# Patient Record
Sex: Female | Born: 1950 | Race: White | Hispanic: No | Marital: Married | Smoking: Former smoker
Health system: Southern US, Community
[De-identification: ages and names within clinical notes are randomized; demographics above are authoritative.]

## PROBLEM LIST (undated history)

## (undated) DIAGNOSIS — I2510 Atherosclerotic heart disease of native coronary artery without angina pectoris: Secondary | ICD-10-CM

## (undated) DIAGNOSIS — E119 Type 2 diabetes mellitus without complications: Secondary | ICD-10-CM

## (undated) DIAGNOSIS — I6529 Occlusion and stenosis of unspecified carotid artery: Secondary | ICD-10-CM

## (undated) HISTORY — PX: CARDIAC SURGERY: SHX584

## (undated) HISTORY — DX: Occlusion and stenosis of unspecified carotid artery: I65.29

---

## 1997-10-13 ENCOUNTER — Other Ambulatory Visit: Admission: RE | Admit: 1997-10-13 | Discharge: 1997-10-13 | Payer: Self-pay | Admitting: Obstetrics and Gynecology

## 1997-11-22 ENCOUNTER — Ambulatory Visit (HOSPITAL_COMMUNITY): Admission: RE | Admit: 1997-11-22 | Discharge: 1997-11-22 | Payer: Self-pay | Admitting: Neurology

## 1999-03-11 ENCOUNTER — Encounter: Payer: Self-pay | Admitting: Urology

## 1999-03-11 ENCOUNTER — Ambulatory Visit (HOSPITAL_COMMUNITY): Admission: RE | Admit: 1999-03-11 | Discharge: 1999-03-11 | Payer: Self-pay | Admitting: Urology

## 1999-03-29 ENCOUNTER — Ambulatory Visit (HOSPITAL_COMMUNITY): Admission: RE | Admit: 1999-03-29 | Discharge: 1999-03-29 | Payer: Self-pay | Admitting: Gastroenterology

## 2000-07-01 ENCOUNTER — Encounter: Admission: RE | Admit: 2000-07-01 | Discharge: 2000-07-01 | Payer: Self-pay | Admitting: Obstetrics and Gynecology

## 2000-07-01 ENCOUNTER — Encounter: Payer: Self-pay | Admitting: Obstetrics and Gynecology

## 2010-09-27 ENCOUNTER — Ambulatory Visit: Payer: Self-pay

## 2010-10-15 ENCOUNTER — Inpatient Hospital Stay (INDEPENDENT_AMBULATORY_CARE_PROVIDER_SITE_OTHER)
Admission: RE | Admit: 2010-10-15 | Discharge: 2010-10-15 | Disposition: A | Discharge status: Home / Self Care | Payer: Self-pay | Source: Ambulatory Visit | Attending: Emergency Medicine | Admitting: Emergency Medicine

## 2010-10-15 DIAGNOSIS — M79609 Pain in unspecified limb: Secondary | ICD-10-CM

## 2010-10-15 LAB — GLUCOSE, CAPILLARY
Glucose-Capillary: 49 mg/dL — ABNORMAL LOW (ref 70–99)
Glucose-Capillary: 99 mg/dL (ref 70–99)

## 2011-09-18 ENCOUNTER — Ambulatory Visit: Payer: Self-pay

## 2011-11-16 ENCOUNTER — Emergency Department (HOSPITAL_COMMUNITY)
Admission: EM | Admit: 2011-11-16 | Discharge: 2011-11-16 | Disposition: A | Discharge status: Home / Self Care | Payer: BC Managed Care – PPO | Source: Home / Self Care | Attending: Family Medicine | Admitting: Family Medicine

## 2011-11-16 ENCOUNTER — Encounter (HOSPITAL_COMMUNITY): Payer: Self-pay

## 2011-11-16 DIAGNOSIS — L03012 Cellulitis of left finger: Secondary | ICD-10-CM

## 2011-11-16 DIAGNOSIS — L03019 Cellulitis of unspecified finger: Secondary | ICD-10-CM

## 2011-11-16 HISTORY — DX: Atherosclerotic heart disease of native coronary artery without angina pectoris: I25.10

## 2011-11-16 HISTORY — DX: Type 2 diabetes mellitus without complications: E11.9

## 2011-11-16 MED ORDER — CEPHALEXIN 500 MG PO CAPS: 500.0000 mg | ORAL_CAPSULE | Freq: Three times a day (TID) | ORAL | Status: DC

## 2011-11-16 NOTE — ED Provider Notes (Signed)
History     CSN: [id number]  Arrival date & time 11/16/11  0802   First MD Initiated Contact with Patient 11/16/11 [phone]      Chief Complaint  Patient presents with  . Finger Injury    (Consider location/radiation/quality/duration/timing/severity/associated sxs/prior treatment) Patient is a 61 y.o. female presenting with hand pain. The history is provided by the patient.  Hand Pain This is a new problem. The current episode started more than 1 week ago (is brittle diabetic, had ails done at salon, soreness and swelling began 13 days ago. no drainage.). The problem has been gradually worsening.    Past Medical History  Diagnosis Date  . Diabetes mellitus without complication   . Coronary artery disease     Past Surgical History  Procedure Date  . Cardiac surgery     History reviewed. No pertinent family history.  History  Substance Use Topics  . Smoking status: Not on file  . Smokeless tobacco: Not on file  . Alcohol Use:     OB History    Grav Para Term Preterm Abortions TAB SAB Ect Mult Living                  Review of Systems  Constitutional: Negative.   Skin: Positive for wound.    Allergies  Demerol and Lomotil  Home Medications   Current Outpatient Rx  Name  Route  Sig  Dispense  Refill  . ASPIRIN 81 MG PO TABS   Oral   Take 81 mg by mouth daily.         . ATENOLOL 25 MG PO TABS   Oral   Take 25 mg by mouth daily.         Marland Kitchen FLUOXETINE HCL 20 MG PO CAPS   Oral   Take 20 mg by mouth daily.         . INSULIN GLARGINE 100 UNIT/ML [place] SOLN   Subcutaneous   Inject into the skin at bedtime.         Marland Kitchen PANTOPRAZOLE SODIUM 40 MG PO TBEC   Oral   Take 40 mg by mouth daily.         Marland Kitchen ROSUVASTATIN CALCIUM 10 MG PO TABS   Oral   Take 10 mg by mouth daily.         Marland Kitchen VALSARTAN 40 MG PO TABS   Oral   Take 40 mg by mouth daily.         . CEPHALEXIN 500 MG PO CAPS   Oral   Take 1 capsule (500 mg total) by mouth 3 (three) times  daily. Take all of medicine and drink lots of fluids   21 capsule   0     BP 114/73  Pulse 80  Temp 98.7 F (37.1 C) (Oral)  Resp 18  SpO2 99%  Physical Exam  Nursing note and vitals reviewed. Constitutional: She is oriented to person, place, and time. She appears well-developed and well-nourished.  Musculoskeletal: She exhibits tenderness.  Neurological: She is alert and oriented to person, place, and time.  Skin: Skin is warm and dry. There is erythema.       Tender nonfluctuant cuticle base of left thumb, no drainage, sl erythema.    ED Course  Procedures (including critical care time)  Labs Reviewed - No data to display No results found.   1. Paronychia of left thumb       MDM  Linna Hoff, MD 11/16/11 [phone]

## 2011-11-16 NOTE — ED Notes (Signed)
Usually has nails done in salon, worked in garden a fe days ago w/o gloves . Noted pain, swelling nail bed w pain shooting into thumb and wrist

## 2011-11-20 ENCOUNTER — Encounter (HOSPITAL_BASED_OUTPATIENT_CLINIC_OR_DEPARTMENT_OTHER): Payer: Self-pay

## 2011-11-20 ENCOUNTER — Ambulatory Visit (HOSPITAL_BASED_OUTPATIENT_CLINIC_OR_DEPARTMENT_OTHER)
Admission: RE | Admit: 2011-11-20 | Discharge: 2011-11-20 | Disposition: A | Discharge status: Home / Self Care | Payer: BC Managed Care – PPO | Source: Ambulatory Visit | Attending: Orthopedic Surgery | Admitting: Orthopedic Surgery

## 2011-11-20 ENCOUNTER — Encounter (HOSPITAL_BASED_OUTPATIENT_CLINIC_OR_DEPARTMENT_OTHER)
Admission: RE | Disposition: A | Discharge status: Home / Self Care | Payer: Self-pay | Source: Ambulatory Visit | Attending: Orthopedic Surgery

## 2011-11-20 DIAGNOSIS — L03019 Cellulitis of unspecified finger: Secondary | ICD-10-CM | POA: Insufficient documentation

## 2011-11-20 HISTORY — PX: INCISION AND DRAINAGE: SHX5863

## 2011-11-20 SURGERY — INCISION AND DRAINAGE
Anesthesia: General | Site: Finger | Laterality: Left | Wound class: Dirty or Infected

## 2011-11-20 SURGICAL SUPPLY — 29 items
BLADE SURG 15 STRL LF DISP TIS (BLADE) IMPLANT
BLADE SURG 15 STRL SS (BLADE) ×2
BNDG COHESIVE 1X5 TAN STRL LF (GAUZE/BANDAGES/DRESSINGS) ×1 IMPLANT
CHLORAPREP W/TINT 26ML (MISCELLANEOUS) ×1 IMPLANT
CLOTH BEACON ORANGE TIMEOUT ST (SAFETY) ×1 IMPLANT
COVER MAYO STAND STRL (DRAPES) ×1 IMPLANT
COVER TABLE BACK 60X90 (DRAPES) ×1 IMPLANT
CUFF TOURNIQUET SINGLE 18IN (TOURNIQUET CUFF) ×1 IMPLANT
DRAPE EXTREMITY T 121X128X90 (DRAPE) ×1 IMPLANT
DRAPE SURG 17X23 STRL (DRAPES) ×1 IMPLANT
GAUZE PACKING IODOFORM 1/4X5 (PACKING) ×1 IMPLANT
GAUZE XEROFORM 1X8 LF (GAUZE/BANDAGES/DRESSINGS) ×1 IMPLANT
GLOVE BIO SURGEON STRL SZ7.5 (GLOVE) ×1 IMPLANT
GLOVE BIOGEL PI IND STRL 8 (GLOVE) IMPLANT
GLOVE BIOGEL PI INDICATOR 8 (GLOVE) ×1
GOWN STRL REIN XL XLG (GOWN DISPOSABLE) ×3 IMPLANT
NDL HYPO 25X1 1.5 SAFETY (NEEDLE) IMPLANT
NEEDLE HYPO 25X1 1.5 SAFETY (NEEDLE) ×2 IMPLANT
NS IRRIG 1000ML POUR BTL (IV SOLUTION) ×1 IMPLANT
PACK BASIN DAY SURGERY FS (CUSTOM PROCEDURE TRAY) ×1 IMPLANT
PADDING CAST ABS 4INX4YD NS (CAST SUPPLIES) ×1
PADDING CAST ABS COTTON 4X4 ST (CAST SUPPLIES) IMPLANT
SPONGE GAUZE 4X4 12PLY (GAUZE/BANDAGES/DRESSINGS) ×1 IMPLANT
STOCKINETTE 4X48 STRL (DRAPES) ×1 IMPLANT
SWAB CULTURE LIQ STUART DBL (MISCELLANEOUS) ×1 IMPLANT
SYR CONTROL 10ML LL (SYRINGE) ×1 IMPLANT
TOWEL OR 17X24 6PK STRL BLUE (TOWEL DISPOSABLE) ×1 IMPLANT
TUBE ANAEROBIC SPECIMEN COL (MISCELLANEOUS) ×1 IMPLANT
UNDERPAD 30X30 INCONTINENT (UNDERPADS AND DIAPERS) ×1 IMPLANT

## 2011-11-21 LAB — POCT I-STAT, CHEM 8
Chloride: 102 mEq/L (ref 96–112)
Creatinine, Ser: 0.7 mg/dL (ref 0.50–1.10)
HCT: 38 % (ref 36.0–46.0)
Hemoglobin: 12.9 g/dL (ref 12.0–15.0)
Potassium: 4 mEq/L (ref 3.5–5.1)
Sodium: 140 mEq/L (ref 135–145)

## 2011-11-21 MED ORDER — BUPIVACAINE HCL 0.25 % IJ SOLN
INTRAMUSCULAR | Status: DC | PRN
  Administered 2011-11-20: 10 mL

## 2011-11-21 NOTE — H&P (Signed)
NAMEMarland Kitchen  ZOELLA, ROBERTI NO.:  [id number]  MEDICAL RECORD NO.:  [id number]  LOCATION:  [facility]                         FACILITY:  [facility]  PHYSICIAN:  Betha Loa, MD        DATE OF BIRTH:  Jun 01, 1950  DATE OF ADMISSION:  11/20/2011 DATE OF DISCHARGE:  11/20/2011                             HISTORY & PHYSICAL   CHIEF COMPLAINT:  Left thumb infection.  HISTORY:  Ms. Bessey is a 61 year old right-hand dominant female with diabetes who states that for past 2 weeks, she has been having problems with the left thumb.  She has worked in the yard and had her nails done at that time.  She began to have swelling and pain in the thumb.  She soaked it.  On November 9, she began to have more erythema and swelling. She has been able to get purulence coming from underneath the cuticle on the radial side.  She presented to Urgent Care where she was started on Keflex.  She states it has gotten worse since then.  She had a little fever on Saturday night but took some narcotic pain medication and thinks that might have been the source of this.  She has not had any problems since.  ALLERGIES:  DEMEROL.  PAST MEDICAL HISTORY:  Diabetes, heart disease, GERD, and hypercholesterolemia.  PAST SURGICAL HISTORY:  Coronary artery bypass grafting 2 vessel in 2001, cholecystectomy, and irrigation and debridement of her neck.  MEDICATIONS: 1. Atenolol. 2. Diovan 40 daily. 3. Lantus. 4. Apidra. 5. Protonix. 6. Aspirin 325 daily. 7. Crestor.  SOCIAL HISTORY:  Ms. Payes works as a Engineer, civil (consulting).  She is with the [employer].  She does not smoke and drinks alcohol socially.  REVIEW OF SYSTEMS:  A 13-point review of systems was negative with exception of fever as above and shortness of breath.  Otherwise negative 13 point review of systems.  PHYSICAL EXAMINATION:  GENERAL:  Alert and oriented x3, well developed, well nourished.  Normal gait pattern. HEART:  Regular rate and  rhythm. LUNGS:  Clear to auscultation bilaterally. ABDOMEN:  Nontender, nondistended. EXTREMITIES:  Bilateral upper extremities are intact to light touch, sensation to capillary refill in all fingertips.  She can flex and extend the IP joint of her thumb to cross her fingers.  Right upper extremity is without wounds, without tenderness to palpation.  She has no wounds.  Left upper extremity with the exception of the thumb, no wounds, no tenderness to palpation.  In the thumb, she is swollen and reddened at the radial side of the cuticle tissue.  There is evidence of drainage from underneath the radial side of the cuticle.  There is some visible yellowness underneath the radial side of the nail.  She is swollen and erythematous on the radial side.  She is not tender on the pad of the thumb.  No tenderness at the IP joint.  She can flex and extend the IP joint without pain.  ASSESSMENT AND PLAN:  Left thumb paronychia.  I discussed with Ms. Wisham the nature of the condition.  I recommended irrigation and debridement of the left thumb.  Risks, benefits, and alternatives of  surgery were discussed including risk of blood loss, infection, damage to nerves, vessels, tendons, ligaments, bone; failure of surgery; need for additional surgery, complications with wound healing, continued pain, continued infection.  She voiced understanding of these risks and elected to proceed.  We will have arrangement today.     Betha Loa, MD     KK/MEDQ  D:  11/20/2011  T:  11/21/2011  Job:  [id number]

## 2011-11-21 NOTE — Op Note (Signed)
NAME:  Melissa Patel, Melissa Patel NO.:  [id number]  MEDICAL RECORD NO.:  [id number]  LOCATION:                               FACILITY:  [facility]  PHYSICIAN:  Betha Loa, MD        DATE OF BIRTH:  1951/01/02  DATE OF PROCEDURE:  11/20/2011 DATE OF DISCHARGE:  11/20/2011                              OPERATIVE REPORT   PREOPERATIVE DIAGNOSIS:  Left thumb paronychia.  POSTOPERATIVE DIAGNOSIS:  Left thumb paronychia.  PROCEDURE:  Irrigation and debridement of left thumb paronychia.  SURGEON:  Betha Loa, MD  ASSISTANT:  Cindee Salt, MD  ANESTHESIA:  General.  IV FLUIDS:  Per anesthesia flow sheet.  ESTIMATED BLOOD LOSS:  Minimal.  COMPLICATIONS:  None.  SPECIMENS:  Cultures to micro.  TOURNIQUET TIME:  11 minutes.  DISPOSITION:  Stable to PACU.  INDICATIONS:  Melissa Patel is a 61 year old right-hand dominant female who has had 2 weeks of problems with her left thumb.  Over the weekend, she noticed it started to become more swollen, erythematous, and painful.  She has been able to squeeze some pus from underneath the cuticle he states.  She was seen at Urgent Care and started on Keflex. She has had no relief.  On evaluation, she had intact sensation and capillary refill in the finger.  She could flex and extend the IP joint of the thumb.  There was a swollen erythematous area on the radial side of the thumb and discoloration under the radial side of the nail.  I recommended Melissa Patel having the nail removed and irrigation and debridement performed.  Risks, benefits, and alternatives of surgery were discussed including risk of blood loss, infection, damage to nerves, vessels, tendons, ligaments, bone; failure of surgery; need for additional surgery, complications with wound healing, continued pain, continued infection, need for repeat irrigation and debridement.  She voiced understanding of these risks and elected to proceed.  OPERATIVE COURSE:  After being  identified preoperatively by myself, the patient and I agreed upon procedure and site procedure.  Surgical site was marked.  The risks, benefits, and alternatives of surgery were reviewed and she wished to proceed.  Surgical consent had been signed. She was transferred to the operating room, placed on the operating room table in supine position with the left upper extremity in arm board. General anesthesia was induced by the anesthesiologist.  The left upper extremity was prepped and draped in normal sterile orthopedic fashion. Surgical pause between surgeons, anesthesia, operating staff, and all were in agreement as to the patient, procedure, and site of procedure. Tourniquet at the proximal aspect of the forearm was inflated to 250 mmHg after exsanguination of the hand and forearm with an Esmarch bandage.  The nail was removed.  There was no gross purulence underneath.  An incision was made under the nail fold into the area of erythema.  A small amount of fluid was obtained.  Cultures were taken and sent to micro for examination.  The patient was given 1000 mg of vancomycin after cultures were taken.  The wound was copiously irrigated with sterile saline.  It was packed open with 0.25-inch iodoform gauze. A piece of  Xeroform was placed in the nail fold.  The wound was dressed with sterile Xeroform, 4 x 4, and wrapped with a Coban dressing lightly. A digital block was performed with 10 mL of 0.25% plain Marcaine to aid in postoperative analgesia.  The patient was awoken from anesthesia safely.  The drapes were broken down and the patient transferred back to stretcher.  She was taken to PACU in stable condition.  I will see her back in the office in 3 days for postoperative followup.  I will give her Norco 5/325, 1-2 p.o. q.6 hours p.r.n. pain, dispensed #30 and Bactrim DS 1 p.o. b.i.d. x7 days.     Betha Loa, MD     KK/MEDQ  D:  11/20/2011  T:  11/21/2011  Job:  [id number]

## 2011-11-22 ENCOUNTER — Encounter (HOSPITAL_BASED_OUTPATIENT_CLINIC_OR_DEPARTMENT_OTHER): Payer: Self-pay | Admitting: Orthopedic Surgery

## 2011-11-22 LAB — WOUND CULTURE

## 2011-11-25 LAB — ANAEROBIC CULTURE: Gram Stain: NONE SEEN

## 2012-01-25 ENCOUNTER — Emergency Department (HOSPITAL_COMMUNITY)
Admission: EM | Admit: 2012-01-25 | Discharge: 2012-01-25 | Disposition: A | Discharge status: Home / Self Care | Payer: BC Managed Care – PPO | Attending: Emergency Medicine | Admitting: Emergency Medicine

## 2012-01-25 ENCOUNTER — Emergency Department (HOSPITAL_COMMUNITY): Payer: BC Managed Care – PPO

## 2012-01-25 ENCOUNTER — Encounter (HOSPITAL_COMMUNITY): Payer: Self-pay | Admitting: *Deleted

## 2012-01-25 DIAGNOSIS — Z87891 Personal history of nicotine dependence: Secondary | ICD-10-CM | POA: Insufficient documentation

## 2012-01-25 DIAGNOSIS — R739 Hyperglycemia, unspecified: Secondary | ICD-10-CM

## 2012-01-25 DIAGNOSIS — Z79899 Other long term (current) drug therapy: Secondary | ICD-10-CM | POA: Insufficient documentation

## 2012-01-25 DIAGNOSIS — R112 Nausea with vomiting, unspecified: Secondary | ICD-10-CM | POA: Insufficient documentation

## 2012-01-25 DIAGNOSIS — I2510 Atherosclerotic heart disease of native coronary artery without angina pectoris: Secondary | ICD-10-CM | POA: Insufficient documentation

## 2012-01-25 DIAGNOSIS — Z7982 Long term (current) use of aspirin: Secondary | ICD-10-CM | POA: Insufficient documentation

## 2012-01-25 DIAGNOSIS — E1169 Type 2 diabetes mellitus with other specified complication: Secondary | ICD-10-CM | POA: Insufficient documentation

## 2012-01-25 DIAGNOSIS — Z794 Long term (current) use of insulin: Secondary | ICD-10-CM | POA: Insufficient documentation

## 2012-01-25 DIAGNOSIS — R109 Unspecified abdominal pain: Secondary | ICD-10-CM | POA: Insufficient documentation

## 2012-01-25 LAB — URINE MICROSCOPIC-ADD ON

## 2012-01-25 LAB — CBC
MCHC: 34.6 g/dL (ref 30.0–36.0)
RDW: 12.4 % (ref 11.5–15.5)

## 2012-01-25 LAB — COMPREHENSIVE METABOLIC PANEL
ALT: 11 U/L (ref 0–35)
AST: 13 U/L (ref 0–37)
Albumin: 3.6 g/dL (ref 3.5–5.2)
Alkaline Phosphatase: 58 U/L (ref 39–117)
Potassium: 3.7 mEq/L (ref 3.5–5.1)
Sodium: 132 mEq/L — ABNORMAL LOW (ref 135–145)
Total Protein: 6.8 g/dL (ref 6.0–8.3)

## 2012-01-25 LAB — GLUCOSE, CAPILLARY
Glucose-Capillary: 446 mg/dL — ABNORMAL HIGH (ref 70–99)
Glucose-Capillary: 229 mg/dL — ABNORMAL HIGH (ref 70–99)

## 2012-01-25 LAB — URINALYSIS, ROUTINE W REFLEX MICROSCOPIC
Bilirubin Urine: NEGATIVE
Hgb urine dipstick: NEGATIVE
Nitrite: NEGATIVE
Specific Gravity, Urine: <1.005 — ABNORMAL LOW (ref 1.005–1.030)
pH: 6 (ref 5.0–8.0)

## 2012-01-25 LAB — POCT I-STAT TROPONIN I

## 2012-01-25 MED ORDER — MORPHINE SULFATE 4 MG/ML IJ SOLN
4.0000 mg | Freq: Once | INTRAMUSCULAR | Status: AC
Start: 2012-01-25 — End: 2012-01-25
  Administered 2012-01-25: 4 mg via INTRAVENOUS
  Filled 2012-01-25: qty 1

## 2012-01-25 MED ORDER — ONDANSETRON HCL 4 MG/2ML IJ SOLN
4.0000 mg | Freq: Once | INTRAMUSCULAR | Status: AC
  Administered 2012-01-25: 4 mg via INTRAVENOUS
  Filled 2012-01-25: qty 2

## 2012-01-25 MED ORDER — GI COCKTAIL ~~LOC~~
30.0000 mL | Freq: Once | ORAL | Status: AC
  Administered 2012-01-25: 30 mL via ORAL
  Filled 2012-01-25: qty 30

## 2012-01-25 MED ORDER — GI COCKTAIL ~~LOC~~: 30.0000 mL | Freq: Once | ORAL | Status: DC

## 2012-01-25 MED ORDER — ONDANSETRON HCL 4 MG PO TABS: 4.0000 mg | ORAL_TABLET | Freq: Four times a day (QID) | ORAL | Status: DC

## 2012-01-25 MED ORDER — INSULIN ASPART 100 UNIT/ML ~~LOC~~ SOLN
5.0000 [IU] | Freq: Once | SUBCUTANEOUS | Status: AC
  Administered 2012-01-25: 5 [IU] via INTRAVENOUS
  Filled 2012-01-25: qty 1

## 2012-01-25 MED ORDER — ESOMEPRAZOLE MAGNESIUM 40 MG PO CPDR: 40.0000 mg | DELAYED_RELEASE_CAPSULE | Freq: Every day | ORAL | Status: DC

## 2012-01-25 NOTE — ED Provider Notes (Addendum)
History     CSN: [id number]  Arrival date & time 01/25/12  7829   First MD Initiated Contact with Patient 01/25/12 [phone]      Chief Complaint  Patient presents with  . Abdominal Pain    (Consider location/radiation/quality/duration/timing/severity/associated sxs/prior treatment) Patient is a 62 y.o. female presenting with abdominal pain. The history is provided by the patient.  Abdominal Pain The primary symptoms of the illness include abdominal pain, nausea and vomiting. The current episode started yesterday. The onset of the illness was gradual. The problem has not changed since onset. The patient states that she believes she is currently not pregnant. The patient has not had a change in bowel habit. Significant associated medical issues include diabetes.    Past Medical History  Diagnosis Date  . Diabetes mellitus without complication   . Coronary artery disease     Past Surgical History  Procedure Date  . Cardiac surgery   . Incision and drainage 11/20/2011    Procedure: INCISION AND DRAINAGE;  Surgeon: Tami Ribas, MD;  Location: [facility];  Service: Orthopedics;  Laterality: Left;  INCISION AND DRAINAGE LEFT THUMB     No family history on file.  History  Substance Use Topics  . Smoking status: Former Games developer  . Smokeless tobacco: Not on file  . Alcohol Use: Yes    OB History    Grav Para Term Preterm Abortions TAB SAB Ect Mult Living                  Review of Systems  Gastrointestinal: Positive for nausea, vomiting and abdominal pain.  All other systems reviewed and are negative.    Allergies  Demerol and Lomotil  Home Medications   Current Outpatient Rx  Name  Route  Sig  Dispense  Refill  . ASPIRIN 81 MG PO TABS   Oral   Take 81 mg by mouth daily.         . ATENOLOL 25 MG PO TABS   Oral   Take 25 mg by mouth daily.         . CEPHALEXIN 500 MG PO CAPS   Oral   Take 1 capsule (500 mg total) by mouth 3 (three) times  daily. Take all of medicine and drink lots of fluids   21 capsule   0   . FLUOXETINE HCL 20 MG PO CAPS   Oral   Take 20 mg by mouth daily.         . INSULIN GLARGINE 100 UNIT/ML [place] SOLN   Subcutaneous   Inject into the skin at bedtime.         Marland Kitchen PANTOPRAZOLE SODIUM 40 MG PO TBEC   Oral   Take 40 mg by mouth daily.         Marland Kitchen ROSUVASTATIN CALCIUM 10 MG PO TABS   Oral   Take 10 mg by mouth daily.         Marland Kitchen VALSARTAN 40 MG PO TABS   Oral   Take 40 mg by mouth daily.           BP 139/74  Pulse 87  Temp 97.9 F (36.6 C) (Oral)  Resp 18  SpO2 100%  Physical Exam  Constitutional: She is oriented to person, place, and time. She appears well-developed and well-nourished.  HENT:  Head: Normocephalic and atraumatic.  Eyes: Conjunctivae normal and EOM are normal. Pupils are equal, round, and reactive to light.  Neck: Normal range of  motion.  Cardiovascular: Normal rate, regular rhythm and normal heart sounds.   Pulmonary/Chest: Effort normal and breath sounds normal.  Abdominal: Soft. Bowel sounds are normal. There is tenderness.    Musculoskeletal: Normal range of motion.  Neurological: She is alert and oriented to person, place, and time.  Skin: Skin is warm and dry.  Psychiatric: She has a normal mood and affect. Her behavior is normal.    ED Course  Procedures (including critical care time)  Labs Reviewed  GLUCOSE, CAPILLARY - Abnormal; Notable for the following:    Glucose-Capillary 446 (*)     All other components within normal limits  CBC  COMPREHENSIVE METABOLIC PANEL  LIPASE, BLOOD  URINALYSIS, ROUTINE W REFLEX MICROSCOPIC   No results found.   No diagnosis found.   Date: 01/25/2012  Rate: 75  Rhythm: normal sinus rhythm  QRS Axis: normal  Intervals: normal  ST/T Wave abnormalities: normal  Conduction Disutrbances: none  Narrative Interpretation: unremarkable     MDM  + abd pain, will labs, reassess  Labs benign.  Improved.  No  dka,  Will dc to fu,  Ret new/worsening sxs     Vanessa Alesi Lytle Michaels, MD 01/25/12 0439  Nashon Erbes Lytle Michaels, MD 01/25/12 0502  Rosanne Ashing, MD 01/25/12 [phone]

## 2012-01-25 NOTE — ED Notes (Addendum)
Worsening lt. Sided abd. Pain. Since Sunday. No change in diet. Normal bm today. Blood sugars in consistent. > 400. But did drop to 50 while at bank. Nauseated and no emesis.

## 2012-01-25 NOTE — ED Notes (Signed)
EDP at bedside  

## 2012-01-25 NOTE — ED Notes (Signed)
Radiology at bedside to get portable chest.

## 2012-04-02 ENCOUNTER — Other Ambulatory Visit: Payer: Self-pay | Admitting: *Deleted

## 2012-04-02 ENCOUNTER — Ambulatory Visit
Admission: RE | Admit: 2012-04-02 | Discharge: 2012-04-02 | Disposition: A | Discharge status: Home / Self Care | Payer: BC Managed Care – PPO | Source: Ambulatory Visit | Attending: *Deleted | Admitting: *Deleted

## 2012-04-02 DIAGNOSIS — M549 Dorsalgia, unspecified: Secondary | ICD-10-CM

## 2012-05-30 ENCOUNTER — Other Ambulatory Visit: Payer: Self-pay | Admitting: Obstetrics and Gynecology

## 2012-05-30 DIAGNOSIS — N6323 Unspecified lump in the left breast, lower outer quadrant: Secondary | ICD-10-CM

## 2012-05-30 DIAGNOSIS — N644 Mastodynia: Secondary | ICD-10-CM

## 2012-06-11 ENCOUNTER — Ambulatory Visit
Admission: RE | Admit: 2012-06-11 | Discharge: 2012-06-11 | Disposition: A | Discharge status: Home / Self Care | Payer: BC Managed Care – PPO | Source: Ambulatory Visit | Attending: Obstetrics and Gynecology | Admitting: Obstetrics and Gynecology

## 2012-06-11 DIAGNOSIS — N644 Mastodynia: Secondary | ICD-10-CM

## 2012-06-11 DIAGNOSIS — N6323 Unspecified lump in the left breast, lower outer quadrant: Secondary | ICD-10-CM

## 2012-06-19 ENCOUNTER — Encounter (HOSPITAL_COMMUNITY): Payer: Self-pay | Admitting: *Deleted

## 2012-06-19 ENCOUNTER — Emergency Department (HOSPITAL_COMMUNITY)
Admission: EM | Admit: 2012-06-19 | Discharge: 2012-06-19 | Disposition: A | Discharge status: Home / Self Care | Payer: BC Managed Care – PPO | Attending: Emergency Medicine | Admitting: Emergency Medicine

## 2012-06-19 ENCOUNTER — Emergency Department (HOSPITAL_COMMUNITY): Payer: BC Managed Care – PPO

## 2012-06-19 DIAGNOSIS — Z7982 Long term (current) use of aspirin: Secondary | ICD-10-CM | POA: Insufficient documentation

## 2012-06-19 DIAGNOSIS — Z794 Long term (current) use of insulin: Secondary | ICD-10-CM | POA: Insufficient documentation

## 2012-06-19 DIAGNOSIS — E162 Hypoglycemia, unspecified: Secondary | ICD-10-CM | POA: Insufficient documentation

## 2012-06-19 DIAGNOSIS — I2510 Atherosclerotic heart disease of native coronary artery without angina pectoris: Secondary | ICD-10-CM | POA: Insufficient documentation

## 2012-06-19 DIAGNOSIS — E1169 Type 2 diabetes mellitus with other specified complication: Secondary | ICD-10-CM | POA: Insufficient documentation

## 2012-06-19 DIAGNOSIS — G40909 Epilepsy, unspecified, not intractable, without status epilepticus: Secondary | ICD-10-CM | POA: Insufficient documentation

## 2012-06-19 DIAGNOSIS — R51 Headache: Secondary | ICD-10-CM | POA: Insufficient documentation

## 2012-06-19 DIAGNOSIS — Z79899 Other long term (current) drug therapy: Secondary | ICD-10-CM | POA: Insufficient documentation

## 2012-06-19 DIAGNOSIS — Z87891 Personal history of nicotine dependence: Secondary | ICD-10-CM | POA: Insufficient documentation

## 2012-06-19 LAB — CBC WITH DIFFERENTIAL/PLATELET
Basophils Absolute: 0 10*3/uL (ref 0.0–0.1)
Basophils Relative: 0 % (ref 0–1)
Eosinophils Relative: 1 % (ref 0–5)
HCT: 35 % — ABNORMAL LOW (ref 36.0–46.0)
Lymphocytes Relative: 20 % (ref 12–46)
MCHC: 33.4 g/dL (ref 30.0–36.0)
MCV: 91.4 fL (ref 78.0–100.0)
Monocytes Absolute: 0.4 10*3/uL (ref 0.1–1.0)
Platelets: 183 10*3/uL (ref 150–400)
RDW: 12.1 % (ref 11.5–15.5)
WBC: 6.8 10*3/uL (ref 4.0–10.5)

## 2012-06-19 LAB — BASIC METABOLIC PANEL
CO2: 27 mEq/L (ref 19–32)
Calcium: 9.1 mg/dL (ref 8.4–10.5)
Creatinine, Ser: 0.42 mg/dL — ABNORMAL LOW (ref 0.50–1.10)
GFR calc Af Amer: >90 mL/min (ref >90)
GFR calc non Af Amer: >90 mL/min (ref >90)
Sodium: 139 mEq/L (ref 135–145)

## 2012-06-19 LAB — GLUCOSE, CAPILLARY
Glucose-Capillary: 213 mg/dL — ABNORMAL HIGH (ref 70–99)
Glucose-Capillary: 300 mg/dL — ABNORMAL HIGH (ref 70–99)

## 2012-06-19 MED ORDER — KETOROLAC TROMETHAMINE 30 MG/ML IJ SOLN
30.0000 mg | Freq: Once | INTRAMUSCULAR | Status: AC
  Administered 2012-06-19: 30 mg via INTRAVENOUS
  Filled 2012-06-19: qty 1

## 2012-06-19 MED ORDER — DIPHENHYDRAMINE HCL 50 MG/ML IJ SOLN
25.0000 mg | Freq: Once | INTRAMUSCULAR | Status: AC
  Administered 2012-06-19: 25 mg via INTRAVENOUS
  Filled 2012-06-19: qty 1

## 2012-06-19 MED ORDER — METOCLOPRAMIDE HCL 5 MG/ML IJ SOLN
10.0000 mg | Freq: Once | INTRAMUSCULAR | Status: AC
  Administered 2012-06-19: 10 mg via INTRAVENOUS
  Filled 2012-06-19: qty 2

## 2012-06-19 MED ORDER — DEXTROSE-NACL 5-0.9 % IV SOLN
Freq: Once | INTRAVENOUS | Status: AC
  Administered 2012-06-19: 19:00:00 via INTRAVENOUS

## 2012-06-19 NOTE — ED Notes (Signed)
[id number]<XB> Expected date:<BR> Expected time:<BR> Means of arrival:<BR> Comments:<BR> ems- hypoglycemia

## 2012-06-19 NOTE — ED Provider Notes (Signed)
History     CSN: [id number]  Arrival date & time 06/19/12  1755   First MD Initiated Contact with Patient 06/19/12 1852      Chief Complaint  Patient presents with  . Hypoglycemia  . Seizures    (Consider location/radiation/quality/duration/timing/severity/associated sxs/prior treatment) The history is provided by the patient.  JAEMARIE HOCHBERG is a 62 y.o. female hx of DM, CAD s/p CABG here with hypoglycemia and possible seizure like activity. 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 Her blood sugar was 200 the time and she gave herself some Lantus and short acting insulin. She then some breakfast and went to bed. Around 3:15 PM husband noted that she was altered and called the ambulance. The CBG was 13 at the time and she was given glucagon and sugar went up. Also at the time he has noticed some jerky bilateral movements but she did not have any incontinence. No history of seizures. She didn't want to come the hospital and called her doctor who sent her in for rule out bleed. No fevers or chills and no changes in her medications. She now has a generalized headache that is gradual in onset.    Past Medical History  Diagnosis Date  . Diabetes mellitus without complication   . Coronary artery disease     Past Surgical History  Procedure Laterality Date  . Cardiac surgery    . Incision and drainage  11/20/2011    Procedure: INCISION AND DRAINAGE;  Surgeon: Tami Ribas, MD;  Location: [facility];  Service: Orthopedics;  Laterality: Left;  INCISION AND DRAINAGE LEFT THUMB     No family history on file.  History  Substance Use Topics  . Smoking status: Former Games developer  . Smokeless tobacco: Not on file  . Alcohol Use: Yes    OB History   Grav Para Term Preterm Abortions TAB SAB Ect Mult Living                  Review of Systems  Neurological: Positive for weakness and headaches.  All other systems reviewed and are  negative.    Allergies  Demerol and Lomotil  Home Medications   Current Outpatient Rx  Name  Route  Sig  Dispense  Refill  . aspirin 325 MG tablet   Oral   Take 325 mg by mouth daily at 12 noon.         Marland Kitchen FLUoxetine (PROZAC) 20 MG capsule   Oral   Take 20 mg by mouth daily.         . insulin glargine (LANTUS) 100 UNIT/ML injection   Subcutaneous   Inject 35 Units into the skin at bedtime.          . insulin glulisine (APIDRA) 100 UNIT/ML injection   Subcutaneous   Inject 1-5 Units into the skin 3 (three) times daily before meals. Sliding scale         . pantoprazole (PROTONIX) 20 MG tablet   Oral   Take 20 mg by mouth 2 (two) times daily.         . rosuvastatin (CRESTOR) 10 MG tablet   Oral   Take 10 mg by mouth daily.         . valsartan (DIOVAN) 40 MG tablet   Oral   Take 20 mg by mouth daily.           BP 133/58  Pulse  84  Temp(Src) 98.3 F (36.8 C) (Oral)  Resp 22  SpO2 99%  Physical Exam  Nursing note and vitals reviewed. Constitutional: She is oriented to person, place, and time.  Tired, mild photophobia   HENT:  Head: Normocephalic.  MM slightly dry   Eyes: Conjunctivae are normal. Pupils are equal, round, and reactive to light.  Neck: Normal range of motion. Neck supple.  Nl ROM, no meningeal signs   Cardiovascular: Normal rate, regular rhythm and normal heart sounds.   Pulmonary/Chest: Effort normal and breath sounds normal. No respiratory distress. She has no wheezes. She has no rales.  Abdominal: Soft. Bowel sounds are normal. She exhibits no distension. There is no tenderness. There is no rebound and no guarding.  Musculoskeletal: Normal range of motion. She exhibits no edema and no tenderness.  Neurological: She is alert and oriented to person, place, and time.  Nl strength and sensation throughout   Skin: Skin is warm and dry.  Psychiatric: She has a normal mood and affect. Her behavior is normal. Judgment and thought  content normal.    ED Course  Procedures (including critical care time)  Labs Reviewed  GLUCOSE, CAPILLARY - Abnormal; Notable for the following:    Glucose-Capillary 213 (*)    All other components within normal limits  CBC WITH DIFFERENTIAL - Abnormal; Notable for the following:    RBC 3.83 (*)    Hemoglobin 11.7 (*)    HCT 35.0 (*)    All other components within normal limits  BASIC METABOLIC PANEL - Abnormal; Notable for the following:    Glucose, Bld 323 (*)    Creatinine, Ser 0.42 (*)    All other components within normal limits  GLUCOSE, CAPILLARY - Abnormal; Notable for the following:    Glucose-Capillary 300 (*)    All other components within normal limits   Ct Head Wo Contrast  06/19/2012   *RADIOLOGY REPORT*  Clinical Data: Hypoglycemia.  Seizure  CT HEAD WITHOUT CONTRAST  Technique:  Contiguous axial images were obtained from the base of the skull through the vertex without contrast.  Comparison: None  Findings: Ventricle size is normal.  Negative for acute or chronic infarct.  Negative for hemorrhage or mass.  No midline shift. Calvarium is intact.  There is mild mucosal edema in the paranasal sinuses.  IMPRESSION: No acute abnormality.   Original Report Authenticated By: Janeece Riggers, M.D.     No diagnosis found.    MDM  JAYLE SOLARZ is a 62 y.o. female here with hypoglycemia and possible seizure activity. Will get CT head and labs. Will PO trial and observe CBG.   9:15 PM Labs unremarkable. CBG stable after 3 hrs. Patient was given food in the ED and IVF. CT head showed no bleed and headache resolved with toradol, reglan. Recommend hold tonight's dose of lantus and f/u with PMD.        Richardean Canal, MD 06/19/12 2116

## 2012-06-19 NOTE — ED Notes (Signed)
Per ems: pt from home, hx of DM - at 1600 pt checked cbg at home, was low. Pt had seizure, was witnessed by pts husband. Reports pt was unresponsive - called ems, 25 g D50 given, cbg up to 150 but pt refused to come into ED. Pt and husband called pt's pcp who advised her to recheck cbg and come into ED, may need CT to rule out bleed. cbg then dropped to 45. EMS came back, 12.5 D10 given. Last cbg 213. bp 117/70, pulse 80 regular, respiration 18. Pt a+ox4 at this time. Husband on route to ED

## 2012-06-20 ENCOUNTER — Telehealth (HOSPITAL_COMMUNITY): Payer: Self-pay | Admitting: Emergency Medicine

## 2012-06-20 NOTE — ED Notes (Signed)
Pt called stated we were supposed to call referral in to "[facility] Endo doctor" and she just called to make an appt and she was told we hadn't sent it.  Current Clinical research associate reviewed discharge papers and they state that pt to f/u w/her PCP.  Pt informed referred to her PCP and they can make referral for her.

## 2012-07-28 ENCOUNTER — Emergency Department (HOSPITAL_COMMUNITY)
Admission: EM | Admit: 2012-07-28 | Discharge: 2012-07-28 | Disposition: A | Discharge status: Home / Self Care | Payer: BC Managed Care – PPO | Attending: Emergency Medicine | Admitting: Emergency Medicine

## 2012-07-28 ENCOUNTER — Encounter (HOSPITAL_COMMUNITY): Payer: Self-pay | Admitting: *Deleted

## 2012-07-28 DIAGNOSIS — IMO0002 Reserved for concepts with insufficient information to code with codable children: Secondary | ICD-10-CM | POA: Insufficient documentation

## 2012-07-28 DIAGNOSIS — Z79899 Other long term (current) drug therapy: Secondary | ICD-10-CM | POA: Insufficient documentation

## 2012-07-28 DIAGNOSIS — I2510 Atherosclerotic heart disease of native coronary artery without angina pectoris: Secondary | ICD-10-CM | POA: Insufficient documentation

## 2012-07-28 DIAGNOSIS — Z794 Long term (current) use of insulin: Secondary | ICD-10-CM | POA: Insufficient documentation

## 2012-07-28 DIAGNOSIS — Z7982 Long term (current) use of aspirin: Secondary | ICD-10-CM | POA: Insufficient documentation

## 2012-07-28 DIAGNOSIS — E162 Hypoglycemia, unspecified: Secondary | ICD-10-CM

## 2012-07-28 DIAGNOSIS — R5381 Other malaise: Secondary | ICD-10-CM | POA: Insufficient documentation

## 2012-07-28 DIAGNOSIS — Z87891 Personal history of nicotine dependence: Secondary | ICD-10-CM | POA: Insufficient documentation

## 2012-07-28 DIAGNOSIS — E1065 Type 1 diabetes mellitus with hyperglycemia: Secondary | ICD-10-CM | POA: Insufficient documentation

## 2012-07-28 LAB — URINALYSIS, ROUTINE W REFLEX MICROSCOPIC
Bilirubin Urine: NEGATIVE
Leukocytes, UA: NEGATIVE
Nitrite: NEGATIVE
Specific Gravity, Urine: 1.022 (ref 1.005–1.030)
Urobilinogen, UA: 1 mg/dL (ref 0.0–1.0)

## 2012-07-28 LAB — CBC WITH DIFFERENTIAL/PLATELET
Basophils Absolute: 0 10*3/uL (ref 0.0–0.1)
Basophils Relative: 0 % (ref 0–1)
Eosinophils Absolute: 0.2 10*3/uL (ref 0.0–0.7)
MCH: 30.9 pg (ref 26.0–34.0)
MCHC: 33.2 g/dL (ref 30.0–36.0)
Monocytes Relative: 9 % (ref 3–12)
Neutro Abs: 6.7 10*3/uL (ref 1.7–7.7)
Neutrophils Relative %: 76 % (ref 43–77)
Platelets: 171 10*3/uL (ref 150–400)
RDW: 12.4 % (ref 11.5–15.5)

## 2012-07-28 LAB — BASIC METABOLIC PANEL
BUN: 11 mg/dL (ref 6–23)
Chloride: 101 mEq/L (ref 96–112)
GFR calc Af Amer: >90 mL/min (ref >90)
GFR calc non Af Amer: >90 mL/min (ref >90)
Potassium: 3.3 mEq/L — ABNORMAL LOW (ref 3.5–5.1)
Sodium: 141 mEq/L (ref 135–145)

## 2012-07-28 LAB — GLUCOSE, CAPILLARY
Glucose-Capillary: 108 mg/dL — ABNORMAL HIGH (ref 70–99)
Glucose-Capillary: 280 mg/dL — ABNORMAL HIGH (ref 70–99)

## 2012-07-28 LAB — URINE MICROSCOPIC-ADD ON

## 2012-07-28 MED ORDER — SODIUM CHLORIDE 0.9 % IV BOLUS (SEPSIS)
1000.0000 mL | Freq: Once | INTRAVENOUS | Status: AC
  Administered 2012-07-28: 1000 mL via INTRAVENOUS

## 2012-07-28 NOTE — ED Notes (Signed)
Per EMS pt came home this morning after working night shift; didn't have anything to eat; husband was having hard tie waking her up; when EMS arrived CBG 22. Given 25 g D10; CBG 188.

## 2012-07-28 NOTE — ED Notes (Signed)
[id number]<AV> Expected date:<BR> Expected time:<BR> Means of arrival:<BR> Comments:<BR> EMS hypoglycemia

## 2012-07-28 NOTE — ED Provider Notes (Signed)
History    CSN: [id number] Arrival date & time 07/28/12  1628  First MD Initiated Contact with Patient 07/28/12 1641     No chief complaint on file.  (Consider location/radiation/quality/duration/timing/severity/associated sxs/prior Treatment) HPI  62 year old female with history of insulin-dependent diabetes, CAD here for the evaluation of hypoglycemia. Patient states she works as a Engineer, civil (consulting) on the night shift.  Last night before she went to work she checked her CBG and it was in the 400s.  She took Lantus 10 unit before going to work. When she got home after the night shift, she ate a sandwich and drink some coffee. She check her blood sugar again and it was in the 400s, patient then take 10 units of insulin, follows with 6 units of Apidra. Later she was found by husband to be unresponsive. She was brought here via EMS when her CBG initially was 22. She has received 25g D10 prior to arrival.  Patient currently at her baseline. She reports feeling hungry, and weak. However no other significant complaints. Denies fever, headache, chest pain, shortness of breath, abdominal pain, numbness or weakness. Denies any recent alcohol or recreational drug use.  Past Medical History  Diagnosis Date  . Diabetes mellitus without complication   . Coronary artery disease    Past Surgical History  Procedure Laterality Date  . Cardiac surgery    . Incision and drainage  11/20/2011    Procedure: INCISION AND DRAINAGE;  Surgeon: Tami Ribas, MD;  Location: [facility];  Service: Orthopedics;  Laterality: Left;  INCISION AND DRAINAGE LEFT THUMB    History reviewed. No pertinent family history. History  Substance Use Topics  . Smoking status: Former Games developer  . Smokeless tobacco: Not on file  . Alcohol Use: Yes   OB History   Grav Para Term Preterm Abortions TAB SAB Ect Mult Living                 Review of Systems  All other systems reviewed and are negative.    Allergies  Demerol  and Lomotil  Home Medications   Current Outpatient Rx  Name  Route  Sig  Dispense  Refill  . aspirin 325 MG tablet   Oral   Take 325 mg by mouth daily at 12 noon.         Marland Kitchen FLUoxetine (PROZAC) 20 MG capsule   Oral   Take 20 mg by mouth daily.         . insulin glargine (LANTUS) 100 UNIT/ML injection   Subcutaneous   Inject 35 Units into the skin at bedtime.          . insulin glulisine (APIDRA) 100 UNIT/ML injection   Subcutaneous   Inject 1-5 Units into the skin 3 (three) times daily before meals. Sliding scale         . pantoprazole (PROTONIX) 20 MG tablet   Oral   Take 20 mg by mouth 2 (two) times daily.         . rosuvastatin (CRESTOR) 10 MG tablet   Oral   Take 10 mg by mouth daily.         . valsartan (DIOVAN) 40 MG tablet   Oral   Take 20 mg by mouth daily.          BP 90/46  Pulse 56  Resp 14  SpO2 97% Physical Exam  Nursing note and vitals reviewed. Constitutional: She is oriented to person, place, and time. She appears  well-developed and well-nourished. No distress.  HENT:  Head: Normocephalic and atraumatic.  Eyes:  Pupils 2mm bilat, reactive.   Neck: Normal range of motion. Neck supple.  Cardiovascular: Normal rate and regular rhythm.   Pulmonary/Chest: Effort normal and breath sounds normal. No respiratory distress. She has no wheezes.  Abdominal: Soft. There is no tenderness.  Musculoskeletal: Normal range of motion. She exhibits no edema.  Neurological: She is alert and oriented to person, place, and time.  Skin: Skin is warm. No rash noted.  Psychiatric: She has a normal mood and affect.    ED Course  Procedures (including critical care time)  4:59 PM Pt initially with AMS 2/2 hypoglycemia.  Pt admits she takes her insulin based on how she felt, without using a sliding scale appropriately.  She is however at her baseline and mentating well.  No other specific complaint.  Plan to check basic labs, continue to monitor pt.     Will have a diabetic counselor to consult pt due to recurrent hypoglycemia episodes due to inappropriate use of insulin.  Pt was seen in ER for similar episodes a month ago.    6:04 PM  Pt able to tolerates PO.  Diabetic counselor was able to communicate with pt and felt pt is taking her medication inappropriately.  She is taking long acting insulin when she should be taking her short acting insulin.  She also is not taking her long acting insulin at the appropriate dose . She is supposed to take 35 units of Lantus at bed time but instead taking only 10 units, which explained her elevated CBG when she wakes up.  She also takes long acting instead of short acting insulin whenever she notice her CBG is elevated.  She also does not use sliding scale to dose.  Diabetic educator will recommend a specific insulin regimen for pt to use until she can be seen with her endocrinologist, Dr. Talmage Coin in August.    Recommend Lantus 18unit once a day.  Hospital sensitive sliding scale.  Pt to avoid using Lantus when her CBG is high, but to use apidra instead, dosing using her sliding scale.  She should continue with calories count.  Return precaution discussed. I also give option of being admitted for further management of her CBG, pt refused. I also explained risk of another hypoglycemic episode if pt unable to manage her CBG appropriately, pt understand the risk. Care discussed with attending.    Labs Reviewed  GLUCOSE, CAPILLARY - Abnormal; Notable for the following:    Glucose-Capillary 108 (*)    All other components within normal limits  CBC WITH DIFFERENTIAL - Abnormal; Notable for the following:    RBC 3.82 (*)    Hemoglobin 11.8 (*)    HCT 35.5 (*)    All other components within normal limits  BASIC METABOLIC PANEL - Abnormal; Notable for the following:    Potassium 3.3 (*)    Glucose, Bld 117 (*)    All other components within normal limits  URINALYSIS, ROUTINE W REFLEX MICROSCOPIC -  Abnormal; Notable for the following:    Glucose, UA >1000 (*)    All other components within normal limits  GLUCOSE, CAPILLARY - Abnormal; Notable for the following:    Glucose-Capillary 280 (*)    All other components within normal limits  URINE MICROSCOPIC-ADD ON   No results found. 1. Hypoglycemia     MDM  BP 103/64  Pulse 77  Temp(Src) 97.5 F (36.4 C) (Oral)  Resp 14  SpO2 100%   Fayrene Helper, PA-C 07/28/12 1935

## 2012-07-28 NOTE — ED Notes (Signed)
Pt undid tegrederm on IV, reported the IV was "in too deep". Charge rn put tegrederm back on IV and assessed site. Flushed IV, site is not infiltrated and IV is not "in too deep".

## 2012-07-28 NOTE — Progress Notes (Addendum)
Inpatient Diabetes Program Recommendations  AACE/ADA: New Consensus Statement on Inpatient Glycemic Control (2013)  Target Ranges:  Prepandial:   less than 140 mg/dL      Peak postprandial:   less than 180 mg/dL (1-2 hours)      Critically ill patients:  140 - 180 mg/dL     Called by Greta Doom, PA from [facility] ED.  Patient was brought to ED with hypoglycemia.    Spoke with patient via phone.  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  Lives in [place] right now, [place].  PCP is in [place], [place] but does have an appointment with Dr. Talmage Coin with [facility] in August.  Per patient, she was diagnosed with DM in 2006, and was told by her PCP that she has Type 1 DM.  Is supposed to be taking Apidra insulin with meals and Lantus 30 units QHS.  Patient told me she usually only takes 10 units of Lantus at night at work and will have extremely high blood sugars in the morning.  Patient admitted to not taking her insulin as prescribed (takes it based on how she feels) and will take the wrong insulin at inappropriate times.  For example, patient told me she had a CBG of >400 mg/dl this morning.  Took Lantus insulin to treat and then wound up with hypoglycemia requiring a visit to the ED.  Explained the different profiles of Lantus and Apidra and explained to patient how each of these insulins work.  Reminded patient that if she has hyperglycemia, she should be using her Apidra to correct these high CBGs (not Lantus).  Reviewed proper hypoglycemia treatment with patient and encouraged her to keep juice boxes at work and at home to treat her low CBGs.  Also encouraged patient to check her CBGs at least 4 times per day and to record all results.  Also encouraged patient to keep her scheduled appointment with Dr. Sharl Ma in August.  Spoke with Greta Doom, [place] and recommended a conservative weight based insulin regimen for patient for home.  Patient  stated weight is 135# (61kg).  Weight based dosing for 0.6 units/kg= 37 units TDD (total daily dose).  1/2 basal would be 18 units of Lantus and Sensitive SSI (1:50 mg/dl above 161 mg/dl) and 0:96 carbohydrate coverage.  [place], [place], print off a copy of the Hospital Sensitive SSI that patient can use until she sees her Endo in August.  Ambrose Finland RN, MSN, CDE Diabetes Coordinator Inpatient Diabetes Program [phone]

## 2012-07-29 NOTE — ED Provider Notes (Signed)
Medical screening examination/treatment/procedure(s) were performed by non-physician practitioner and as supervising physician I was immediately available for consultation/collaboration.   Ashby Dawes, MD 07/29/12 [phone]

## 2012-08-19 ENCOUNTER — Emergency Department (HOSPITAL_COMMUNITY): Payer: BC Managed Care – PPO

## 2012-08-19 ENCOUNTER — Emergency Department (HOSPITAL_COMMUNITY)
Admission: EM | Admit: 2012-08-19 | Discharge: 2012-08-19 | Disposition: A | Discharge status: Home / Self Care | Payer: BC Managed Care – PPO | Attending: Emergency Medicine | Admitting: Emergency Medicine

## 2012-08-19 ENCOUNTER — Encounter (HOSPITAL_COMMUNITY): Payer: Self-pay | Admitting: Emergency Medicine

## 2012-08-19 DIAGNOSIS — R1013 Epigastric pain: Secondary | ICD-10-CM | POA: Insufficient documentation

## 2012-08-19 DIAGNOSIS — G8929 Other chronic pain: Secondary | ICD-10-CM | POA: Insufficient documentation

## 2012-08-19 DIAGNOSIS — Z79899 Other long term (current) drug therapy: Secondary | ICD-10-CM | POA: Insufficient documentation

## 2012-08-19 DIAGNOSIS — Z87891 Personal history of nicotine dependence: Secondary | ICD-10-CM | POA: Insufficient documentation

## 2012-08-19 DIAGNOSIS — Z794 Long term (current) use of insulin: Secondary | ICD-10-CM | POA: Insufficient documentation

## 2012-08-19 DIAGNOSIS — R109 Unspecified abdominal pain: Secondary | ICD-10-CM

## 2012-08-19 DIAGNOSIS — Z7982 Long term (current) use of aspirin: Secondary | ICD-10-CM | POA: Insufficient documentation

## 2012-08-19 DIAGNOSIS — R112 Nausea with vomiting, unspecified: Secondary | ICD-10-CM | POA: Insufficient documentation

## 2012-08-19 DIAGNOSIS — Z9889 Other specified postprocedural states: Secondary | ICD-10-CM | POA: Insufficient documentation

## 2012-08-19 DIAGNOSIS — E119 Type 2 diabetes mellitus without complications: Secondary | ICD-10-CM | POA: Insufficient documentation

## 2012-08-19 DIAGNOSIS — K5900 Constipation, unspecified: Secondary | ICD-10-CM | POA: Insufficient documentation

## 2012-08-19 LAB — COMPREHENSIVE METABOLIC PANEL
ALT: 14 U/L (ref 0–35)
Albumin: 3.8 g/dL (ref 3.5–5.2)
BUN: 14 mg/dL (ref 6–23)
Calcium: 9.4 mg/dL (ref 8.4–10.5)
GFR calc Af Amer: >90 mL/min (ref >90)
Glucose, Bld: 244 mg/dL — ABNORMAL HIGH (ref 70–99)
Potassium: 3.8 mEq/L (ref 3.5–5.1)
Sodium: 136 mEq/L (ref 135–145)
Total Protein: 6.7 g/dL (ref 6.0–8.3)

## 2012-08-19 LAB — LIPASE, BLOOD: Lipase: 10 U/L — ABNORMAL LOW (ref 11–59)

## 2012-08-19 LAB — URINALYSIS, ROUTINE W REFLEX MICROSCOPIC
Nitrite: NEGATIVE
Specific Gravity, Urine: 1.026 (ref 1.005–1.030)
Urobilinogen, UA: 4 mg/dL — ABNORMAL HIGH (ref 0.0–1.0)
pH: 7.5 (ref 5.0–8.0)

## 2012-08-19 LAB — CBC WITH DIFFERENTIAL/PLATELET
Basophils Relative: 1 % (ref 0–1)
Eosinophils Absolute: 0.1 10*3/uL (ref 0.0–0.7)
Eosinophils Relative: 3 % (ref 0–5)
Lymphs Abs: 1.7 10*3/uL (ref 0.7–4.0)
MCH: 31.2 pg (ref 26.0–34.0)
MCHC: 33.7 g/dL (ref 30.0–36.0)
MCV: 92.6 fL (ref 78.0–100.0)
Neutrophils Relative %: 56 % (ref 43–77)
Platelets: 210 10*3/uL (ref 150–400)
RBC: 3.94 MIL/uL (ref 3.87–5.11)

## 2012-08-19 MED ORDER — MORPHINE SULFATE 4 MG/ML IJ SOLN
4.0000 mg | Freq: Once | INTRAMUSCULAR | Status: AC
  Administered 2012-08-19: 4 mg via INTRAVENOUS
  Filled 2012-08-19: qty 1

## 2012-08-19 MED ORDER — IOHEXOL 300 MG/ML  SOLN
100.0000 mL | Freq: Once | INTRAMUSCULAR | Status: AC | PRN
  Administered 2012-08-19: 100 mL via INTRAVENOUS

## 2012-08-19 MED ORDER — ONDANSETRON HCL 4 MG/2ML IJ SOLN
4.0000 mg | Freq: Once | INTRAMUSCULAR | Status: AC
  Administered 2012-08-19: 4 mg via INTRAVENOUS
  Filled 2012-08-19: qty 2

## 2012-08-19 MED ORDER — SODIUM CHLORIDE 0.9 % IV SOLN
Freq: Once | INTRAVENOUS | Status: AC
  Administered 2012-08-19: 14:00:00 via INTRAVENOUS

## 2012-08-19 MED ORDER — ONDANSETRON HCL 8 MG PO TABS: 8.0000 mg | ORAL_TABLET | Freq: Three times a day (TID) | ORAL | Status: DC | PRN

## 2012-08-19 MED ORDER — IOHEXOL 300 MG/ML  SOLN
50.0000 mL | Freq: Once | INTRAMUSCULAR | Status: AC | PRN
  Administered 2012-08-19: 50 mL via ORAL

## 2012-08-19 NOTE — ED Provider Notes (Signed)
CSN: [id number]     Arrival date & time 08/19/12  1048 History     First MD Initiated Contact with Patient 08/19/12 1204     Chief Complaint  Patient presents with  . Abdominal Pain  . Nausea  . Emesis   (Consider location/radiation/quality/duration/timing/severity/associated sxs/prior Treatment) HPI Comments: Melissa Patel is a 62 y.o. Female who presents for evaluation of an exacerbation of her chronic abdominal pain." It is associated with nausea, without vomiting, or diarrhea. She last had a similar exacerbation one week ago. She has daily abdominal pain, which usually improves with rest. Last night, she took a Percocet that she had left over, but it did not help. She also uses Zofran, frequently, but has run out. She had a screening colonoscopy 1 year ago, during which she had a biopsy, and was scheduled for a repeat colonoscopy in 5 years. She denies fever, chills, cough, chest pain, shortness of breath, weakness, or dizziness. There's been no dysuria or urinary frequency. She is taking her usual medications. Her blood sugars have been running about 180. No other known modifying factors  Patient is a 62 y.o. female presenting with abdominal pain and vomiting. The history is provided by the patient.  Abdominal Pain Associated symptoms: vomiting   Emesis Associated symptoms: abdominal pain     Past Medical History  Diagnosis Date  . Diabetes mellitus without complication   . Coronary artery disease    Past Surgical History  Procedure Laterality Date  . Cardiac surgery    . Incision and drainage  11/20/2011    Procedure: INCISION AND DRAINAGE;  Surgeon: Tami Ribas, MD;  Location: [facility];  Service: Orthopedics;  Laterality: Left;  INCISION AND DRAINAGE LEFT THUMB    No family history on file. History  Substance Use Topics  . Smoking status: Former Games developer  . Smokeless tobacco: Not on file  . Alcohol Use: Yes   OB History   Grav Para Term Preterm  Abortions TAB SAB Ect Mult Living                 Review of Systems  Gastrointestinal: Positive for vomiting and abdominal pain.  All other systems reviewed and are negative.    Allergies  Demerol and Lomotil  Home Medications   Current Outpatient Rx  Name  Route  Sig  Dispense  Refill  . aspirin 325 MG tablet   Oral   Take 325 mg by mouth daily at 12 noon.         Marland Kitchen FLUoxetine (PROZAC) 20 MG capsule   Oral   Take 20 mg by mouth daily.         . injection device for insulin (HUMAPEN LUXURA HD) DEVI   Other   1-5 Units by Other route 3 (three) times daily. Sliding scale         . insulin glargine (LANTUS) 100 UNIT/ML injection   Subcutaneous   Inject 5 Units into the skin daily.          . pantoprazole (PROTONIX) 20 MG tablet   Oral   Take 20 mg by mouth 2 (two) times daily.         . rosuvastatin (CRESTOR) 10 MG tablet   Oral   Take 10 mg by mouth daily.         . valsartan (DIOVAN) 40 MG tablet   Oral   Take 20 mg by mouth daily.  BP 133/80  Pulse 72  Temp(Src) 97.9 F (36.6 C) (Oral)  Resp 16  Ht 5\' 1"  (1.549 m)  Wt 138 lb 9.6 oz (62.869 kg)  BMI 26.2 kg/m2  SpO2 99% Physical Exam  Nursing note and vitals reviewed. Constitutional: She is oriented to person, place, and time. She appears well-developed.  Appears younger than stated age  HENT:  Head: Normocephalic and atraumatic.  Eyes: Conjunctivae and EOM are normal. Pupils are equal, round, and reactive to light.  Neck: Normal range of motion and phonation normal. Neck supple.  Cardiovascular: Normal rate, regular rhythm and intact distal pulses.   Pulmonary/Chest: Effort normal and breath sounds normal. She exhibits no tenderness.  Abdominal: Soft. Bowel sounds are normal. She exhibits no distension and no mass. There is tenderness (Mild epigastric tenderness.). There is no rebound and no guarding.  Musculoskeletal: Normal range of motion.  Neurological: She is alert and  oriented to person, place, and time. She has normal strength. She exhibits normal muscle tone.  Skin: Skin is warm and dry.  Psychiatric: She has a normal mood and affect. Her behavior is normal. Judgment and thought content normal.    ED Course   Procedures (including critical care time)   Medications  ondansetron (ZOFRAN) injection 4 mg (4 mg Intravenous Given 08/19/12 1333)  iohexol (OMNIPAQUE) 300 MG/ML solution 50 mL (50 mLs Oral Contrast Given 08/19/12 1258)  morphine 4 MG/ML injection 4 mg (4 mg Intravenous Given 08/19/12 1336)  0.9 %  sodium chloride infusion ( Intravenous New Bag/Given 08/19/12 1333)  iohexol (OMNIPAQUE) 300 MG/ML solution 100 mL (100 mLs Intravenous Contrast Given 08/19/12 1422)  morphine 4 MG/ML injection 4 mg (4 mg Intravenous Given 08/19/12 1546)  ondansetron (ZOFRAN) injection 4 mg (4 mg Intravenous Given 08/19/12 1543)   Patient Vitals for the past 24 hrs:  BP Temp Temp src Pulse Resp SpO2 Height Weight  08/19/12 1541 133/80 mmHg 97.9 F (36.6 C) Oral 72 16 99 % - -  08/19/12 1058 131/82 mmHg 97.8 F (36.6 C) Oral 84 18 98 % 5\' 1"  (1.549 m) 138 lb 9.6 oz (62.869 kg)   4:40 PM Reevaluation with update and discussion. After initial assessment and treatment, an updated evaluation reveals she is comfortable. Now. She has tolerated the oral contrast, without vomiting. [name]    Labs Reviewed  COMPREHENSIVE METABOLIC PANEL - Abnormal; Notable for the following:    Glucose, Bld 244 (*)    Creatinine, Ser 0.48 (*)    All other components within normal limits  LIPASE, BLOOD - Abnormal; Notable for the following:    Lipase 10 (*)    All other components within normal limits  URINALYSIS, ROUTINE W REFLEX MICROSCOPIC - Abnormal; Notable for the following:    Glucose, UA 250 (*)    Urobilinogen, UA 4.0 (*)    All other components within normal limits  CBC WITH DIFFERENTIAL   Ct Abdomen Pelvis W Contrast  08/19/2012   *RADIOLOGY REPORT*  Clinical Data:   left upper abdominal pain, nausea, vomiting, prior cholecystectomy and hysterectomy.  History of nephrolithiasis  CT ABDOMEN AND PELVIS WITH CONTRAST  Technique:  Multidetector CT imaging of the abdomen and pelvis was performed following the standard protocol during bolus administration of intravenous contrast.  Contrast: 50mL OMNIPAQUE IOHEXOL 300 MG/ML  SOLN, OMNIPAQUE IOHEXOL 300 MG/ML  SOLN  Comparison: None.  Findings: Minor bibasilar atelectasis.  Lateral left lower lobe subpleural nodular scarring evident.  No pericardial or pleural effusion.  Prior median sternotomy noted.  Normal heart size.  Abdomen:  Prior cholecystectomy evident.  Slight biliary prominence, suspect post cholecystectomy effect.  Liver, biliary system, atrophic pancreas, spleen, and adrenal glands are within normal limits for age and demonstrate no acute process.  Kidneys demonstrate no renal obstruction, hydronephrosis, or focal abnormality.  5 mm nonobstructing renal calculus in the left kidney upper to mid pole, image 25.  No obstructing ureteral calculus on either side.  Negative for bowel obstruction, dilatation, ileus, or free air. Gastric distention noted, nonspecific.  The colon is stool-filled. Appendix is not identified.  No signs of appendicitis.  No abdominal free fluid, fluid collection, hemorrhage, abscess, or adenopathy.  Aortic atherosclerosis noted without aneurysm or occlusive process.  Pelvis:  Moderate distention of the urinary bladder.  Prior hysterectomy noted.  No pelvic free fluid, fluid collection, hemorrhage, abscess, adenopathy, inguinal abnormality, or hernia. No acute distal bowel process.  Diffuse degenerative changes of the spine and pelvis.  IMPRESSION: Prior cholecystectomy and hysterectomy.  5 mm nonobstructing left intrarenal calculus.  Retained stool throughout the colon compatible with constipation.  No acute intra-abdominal or pelvic finding.   Original Report Authenticated By: Judie Petit. Shick, M.D.    1. Abdominal pain   2. Constipation     MDM  Nonspecific abdominal pain, improved after treatment. No evidence for colitis, appendicitis or bowel obstruction. Doubt ischemic bowel disease. Doubt metabolic instability, serious bacterial infection or impending vascular collapse; the patient is stable for discharge.   Nursing Notes Reviewed/ Care Coordinated, and agree without changes. Applicable Imaging Reviewed.  Interpretation of Laboratory Data incorporated into ED treatment   Plan: Home Medications- Zofran, Mg. Citrate, Colace; Home Treatments and Observation- rest, fiber, fluids; return here if the recommended treatment, does not improve the symptoms; Recommended follow up- PCP and GI prn    Flint Melter, MD 08/20/12 [phone]

## 2012-08-19 NOTE — ED Notes (Signed)
Pt urine sent and verified by Amber Cox. 

## 2012-08-19 NOTE — Discharge Instructions (Signed)
Stop taking Imodium. Use magnesium citrate today and tomorrow to help initiate stooling. After that take Colace one or 2 times a day to keep the stools soft. Drink plenty of fluids and eat plenty of fiber.     Abdominal Pain Abdominal pain can be caused by many things. Your caregiver decides the seriousness of your pain by an examination and possibly blood tests and X-rays. Many cases can be observed and treated at home. Most abdominal pain is not caused by a disease and will probably improve without treatment. However, in many cases, more time must pass before a clear cause of the pain can be found. Before that point, it may not be known if you need more testing, or if hospitalization or surgery is needed. HOME CARE INSTRUCTIONS   Do not take laxatives unless directed by your caregiver.  Take pain medicine only as directed by your caregiver.  Only take over-the-counter or prescription medicines for pain, discomfort, or fever as directed by your caregiver.  Try a clear liquid diet (broth, tea, or water) for as long as directed by your caregiver. Slowly move to a bland diet as tolerated. SEEK IMMEDIATE MEDICAL CARE IF:   The pain does not go away.  You have a fever.  You keep throwing up (vomiting).  The pain is felt only in portions of the abdomen. Pain in the right side could possibly be appendicitis. In an adult, pain in the left lower portion of the abdomen could be colitis or diverticulitis.  You pass bloody or black tarry stools. MAKE SURE YOU:   Understand these instructions.  Will watch your condition.  Will get help right away if you are not doing well or get worse. Document Released: 10/04/2004 Document Revised: 03/19/2011 Document Reviewed: 08/13/2007 [facility] Patient Information 2014 [place], [place].  Constipation, Adult Constipation is when a person has fewer than 3 bowel movements a week; has difficulty having a bowel movement; or has stools that are dry, hard,  or larger than normal. As people grow older, constipation is more common. If you try to fix constipation with medicines that make you have a bowel movement (laxatives), the problem may get worse. Long-term laxative use may cause the muscles of the colon to become weak. A low-fiber diet, not taking in enough fluids, and taking certain medicines may make constipation worse. CAUSES   Certain medicines, such as antidepressants, pain medicine, iron supplements, antacids, and water pills.   Certain diseases, such as diabetes, irritable bowel syndrome (IBS), thyroid disease, or depression.   Not drinking enough water.   Not eating enough fiber-rich foods.   Stress or travel.  Lack of physical activity or exercise.  Not going to the restroom when there is the urge to have a bowel movement.  Ignoring the urge to have a bowel movement.  Using laxatives too much. SYMPTOMS   Having fewer than 3 bowel movements a week.   Straining to have a bowel movement.   Having hard, dry, or larger than normal stools.   Feeling full or bloated.   Pain in the lower abdomen.  Not feeling relief after having a bowel movement. DIAGNOSIS  Your caregiver will take a medical history and perform a physical exam. Further testing may be done for severe constipation. Some tests may include:   A barium enema X-ray to examine your rectum, colon, and sometimes, your small intestine.  A sigmoidoscopy to examine your lower colon.  A colonoscopy to examine your entire colon. TREATMENT  Treatment will  depend on the severity of your constipation and what is causing it. Some dietary treatments include drinking more fluids and eating more fiber-rich foods. Lifestyle treatments may include regular exercise. If these diet and lifestyle recommendations do not help, your caregiver may recommend taking over-the-counter laxative medicines to help you have bowel movements. Prescription medicines may be prescribed if  over-the-counter medicines do not work.  HOME CARE INSTRUCTIONS   Increase dietary fiber in your diet, such as fruits, vegetables, whole grains, and beans. Limit high-fat and processed sugars in your diet, such as [place] fries, hamburgers, cookies, candies, and soda.   A fiber supplement may be added to your diet if you cannot get enough fiber from foods.   Drink enough fluids to keep your urine clear or pale yellow.   Exercise regularly or as directed by your caregiver.   Go to the restroom when you have the urge to go. Do not hold it.  Only take medicines as directed by your caregiver. Do not take other medicines for constipation without talking to your caregiver first. SEEK IMMEDIATE MEDICAL CARE IF:   You have bright red blood in your stool.   Your constipation lasts for more than 4 days or gets worse.   You have abdominal or rectal pain.   You have thin, pencil-like stools.  You have unexplained weight loss. MAKE SURE YOU:   Understand these instructions.  Will watch your condition.  Will get help right away if you are not doing well or get worse. Document Released: 09/23/2003 Document Revised: 03/19/2011 Document Reviewed: 11/28/2010 [facility] Patient Information 2014 [place], [place].  Constipation, Adult Constipation is when a person has fewer than 3 bowel movements a week; has difficulty having a bowel movement; or has stools that are dry, hard, or larger than normal. As people grow older, constipation is more common. If you try to fix constipation with medicines that make you have a bowel movement (laxatives), the problem may get worse. Long-term laxative use may cause the muscles of the colon to become weak. A low-fiber diet, not taking in enough fluids, and taking certain medicines may make constipation worse. CAUSES   Certain medicines, such as antidepressants, pain medicine, iron supplements, antacids, and water pills.   Certain diseases, such as diabetes,  irritable bowel syndrome (IBS), thyroid disease, or depression.   Not drinking enough water.   Not eating enough fiber-rich foods.   Stress or travel.  Lack of physical activity or exercise.  Not going to the restroom when there is the urge to have a bowel movement.  Ignoring the urge to have a bowel movement.  Using laxatives too much. SYMPTOMS   Having fewer than 3 bowel movements a week.   Straining to have a bowel movement.   Having hard, dry, or larger than normal stools.   Feeling full or bloated.   Pain in the lower abdomen.  Not feeling relief after having a bowel movement. DIAGNOSIS  Your caregiver will take a medical history and perform a physical exam. Further testing may be done for severe constipation. Some tests may include:   A barium enema X-ray to examine your rectum, colon, and sometimes, your small intestine.  A sigmoidoscopy to examine your lower colon.  A colonoscopy to examine your entire colon. TREATMENT  Treatment will depend on the severity of your constipation and what is causing it. Some dietary treatments include drinking more fluids and eating more fiber-rich foods. Lifestyle treatments may include regular exercise. If these diet and  lifestyle recommendations do not help, your caregiver may recommend taking over-the-counter laxative medicines to help you have bowel movements. Prescription medicines may be prescribed if over-the-counter medicines do not work.  HOME CARE INSTRUCTIONS   Increase dietary fiber in your diet, such as fruits, vegetables, whole grains, and beans. Limit high-fat and processed sugars in your diet, such as [place] fries, hamburgers, cookies, candies, and soda.   A fiber supplement may be added to your diet if you cannot get enough fiber from foods.   Drink enough fluids to keep your urine clear or pale yellow.   Exercise regularly or as directed by your caregiver.   Go to the restroom when you have the urge  to go. Do not hold it.  Only take medicines as directed by your caregiver. Do not take other medicines for constipation without talking to your caregiver first. SEEK IMMEDIATE MEDICAL CARE IF:   You have bright red blood in your stool.   Your constipation lasts for more than 4 days or gets worse.   You have abdominal or rectal pain.   You have thin, pencil-like stools.  You have unexplained weight loss. MAKE SURE YOU:   Understand these instructions.  Will watch your condition.  Will get help right away if you are not doing well or get worse. Document Released: 09/23/2003 Document Revised: 03/19/2011 Document Reviewed: 11/28/2010 [facility] Patient Information 2014 [place], [place].

## 2012-08-19 NOTE — ED Notes (Signed)
Patient transported to CT 

## 2012-08-19 NOTE — ED Notes (Signed)
Pt c/o abd pain and nausea x several years but it "gets bad sometimes".  Also c/o CP.

## 2012-08-26 ENCOUNTER — Ambulatory Visit: Payer: BC Managed Care – PPO | Admitting: *Deleted

## 2012-09-24 ENCOUNTER — Ambulatory Visit: Payer: BC Managed Care – PPO | Admitting: *Deleted

## 2012-10-14 ENCOUNTER — Encounter: Payer: Self-pay | Admitting: *Deleted

## 2012-10-14 ENCOUNTER — Encounter: Payer: BC Managed Care – PPO | Attending: Internal Medicine | Admitting: *Deleted

## 2012-10-14 VITALS — Ht 62.0 in | Wt 136.0 lb

## 2012-10-14 DIAGNOSIS — IMO0002 Reserved for concepts with insufficient information to code with codable children: Secondary | ICD-10-CM | POA: Insufficient documentation

## 2012-10-14 DIAGNOSIS — E162 Hypoglycemia, unspecified: Secondary | ICD-10-CM

## 2012-10-14 DIAGNOSIS — Z713 Dietary counseling and surveillance: Secondary | ICD-10-CM | POA: Insufficient documentation

## 2012-10-14 DIAGNOSIS — E1065 Type 1 diabetes mellitus with hyperglycemia: Secondary | ICD-10-CM | POA: Insufficient documentation

## 2012-10-14 NOTE — Progress Notes (Signed)
Appt start time: 1630 end time:  1800.  Assessment:  Patient was seen on  10/14/12 for individual diabetes education. She states history of DM for 9 years. She states recent history of hypoglycemia where she needed assistance repeatedly lately. She is interested in more information about insulin pumps too. Lives with husband and grandson (62 yo). She shops and cooks most meals. Works for [employer] unit from 7 AM to 3 PM but works an average of over 60 hours a week with overtime. SMBG 4-6 times a day. States she might double her Lantus if BG is over 500 at night.  Current HbA1c: patient states 10%  MEDICATIONS: see list. Diabetes medications include Lantus @ 5 units in AM and 5 units at PM and Humalog @ 5 units for meal plus 0.5 units for every 50 mg/dl over 409.   DIETARY INTAKE:  Usual eating pattern includes 2-3 meals and 2-3 snacks per day.  Everyday foods include easily prepared foods.  Avoided foods include greasy foods.    24-hr recall:  B ( AM): skips usually except for coffee OR cheerios OR grits with bacon Snk ( AM): none OR PNB crackers OR fresh fruit L ( PM): skips or eats late due to work schedule Snk ( PM): grabs food because very hungry when she gets home D ( PM): vegetables, starch, small amounts of meat, water Snk ( PM): popcorn OR PNB crackers OR small bag of chips while reading Beverages: coffee, water  Usual physical activity: walks after dinner each night  Estimated energy needs: 1200 calories 135 g carbohydrates 90 g protein 33 g fat  Progress Towards Goal(s):  In progress.   Nutritional Diagnosis:  NB-1.1 Food and nutrition-related knowledge deficit As related to diabetes control.  As evidenced by patient reported A1c of 10%.    Intervention:  Nutrition counseling provided.  Discussed diabetes disease process and treatment options.  Discussed physiology of both types of diabetes.    Provided education on macronutrients on glucose levels.   Provided education on carb counting, importance of regularly scheduled meals/snacks, and meal planning  Discussed effects of physical activity on glucose levels and long-term glucose control.    Reviewed patient medications.  Discussed role of medication on blood glucose and possible side effects  Discussed blood glucose monitoring and interpretation.  Discussed recommended target ranges and individual ranges.    Described short-term complications: hyper- and hypo-glycemia.  Discussed causes,symptoms, and treatment options.  Discussed role of stress on blood glucose levels and discussed strategies to manage psychosocial issues. Provided information on:  Prevention, detection, and treatment of long-term complications.  Discussed the role of prolonged elevated glucose levels on body systems.  Recommendations for long-term diabetes self-care.  Established checklist for medical, dental, and emotional self-care.  Plan: Take Lantus at consistent times each morning and night @ 5 units each time (do not double that dose anymore) Take Humalog before each meal @ 5 units for the meal and add 0.5 units for every 50 points your are above 100 Use the Humalog to treat high BGs, not the Lantus. Consider eating 3 meals a day with about 2 Carb Choices each time until we meet again. When you come back we will talk more about managing your BG and eventually insulin pumps.  Handouts given during visit include:  Living Well with Diabetes  Meal Plan Card  Barriers to learning/adherance to lifestyle change: patient is very stressed over high BG's and her work hours   Diabetes  self-care support plan:   [facility] support group  Continued education regarding insulin and options for insulin delivery  Monitoring/Evaluation:  Dietary intake, exercise, adequate insulin dosage, and body weight in 3 week(s).

## 2012-10-14 NOTE — Patient Instructions (Signed)
Plan: Take Lantus at consistent times each morning and night @ 5 units each time (do not double that dose anymore) Take Humalog before each meal @ 5 units for the meal and add 0.5 units for every 50 points your are above 100 Use the Humalog to treat high BGs, not the Lantus. Consider eating 3 meals a day with about 2 Carb Choices each time until we meet again. When you come back we will talk more about managing your BG and eventually insulin pumps.

## 2012-10-21 ENCOUNTER — Other Ambulatory Visit: Payer: Self-pay

## 2012-10-21 DIAGNOSIS — Z1231 Encounter for screening mammogram for malignant neoplasm of breast: Secondary | ICD-10-CM

## 2012-10-31 ENCOUNTER — Ambulatory Visit: Payer: BC Managed Care – PPO | Admitting: *Deleted

## 2012-11-04 ENCOUNTER — Ambulatory Visit: Payer: BC Managed Care – PPO | Admitting: *Deleted

## 2012-11-18 ENCOUNTER — Ambulatory Visit: Payer: BC Managed Care – PPO

## 2012-11-21 ENCOUNTER — Ambulatory Visit
Admission: RE | Admit: 2012-11-21 | Discharge: 2012-11-21 | Disposition: A | Discharge status: Home / Self Care | Payer: BC Managed Care – PPO | Source: Ambulatory Visit

## 2012-11-21 ENCOUNTER — Encounter (INDEPENDENT_AMBULATORY_CARE_PROVIDER_SITE_OTHER): Payer: BC Managed Care – PPO | Admitting: Ophthalmology

## 2012-11-21 ENCOUNTER — Other Ambulatory Visit: Payer: Self-pay | Admitting: Obstetrics and Gynecology

## 2012-11-21 DIAGNOSIS — E1139 Type 2 diabetes mellitus with other diabetic ophthalmic complication: Secondary | ICD-10-CM

## 2012-11-21 DIAGNOSIS — N644 Mastodynia: Secondary | ICD-10-CM

## 2012-11-21 DIAGNOSIS — E11319 Type 2 diabetes mellitus with unspecified diabetic retinopathy without macular edema: Secondary | ICD-10-CM

## 2012-11-21 DIAGNOSIS — H43819 Vitreous degeneration, unspecified eye: Secondary | ICD-10-CM

## 2012-11-21 DIAGNOSIS — Z1231 Encounter for screening mammogram for malignant neoplasm of breast: Secondary | ICD-10-CM

## 2012-11-21 DIAGNOSIS — H33309 Unspecified retinal break, unspecified eye: Secondary | ICD-10-CM

## 2012-11-21 DIAGNOSIS — H2510 Age-related nuclear cataract, unspecified eye: Secondary | ICD-10-CM

## 2012-11-25 ENCOUNTER — Other Ambulatory Visit: Payer: Self-pay | Admitting: *Deleted

## 2012-12-05 ENCOUNTER — Ambulatory Visit: Payer: BC Managed Care – PPO

## 2012-12-08 ENCOUNTER — Ambulatory Visit (INDEPENDENT_AMBULATORY_CARE_PROVIDER_SITE_OTHER): Payer: BC Managed Care – PPO | Admitting: Ophthalmology

## 2012-12-08 DIAGNOSIS — H33309 Unspecified retinal break, unspecified eye: Secondary | ICD-10-CM

## 2012-12-21 ENCOUNTER — Encounter (HOSPITAL_COMMUNITY): Payer: Self-pay | Admitting: Emergency Medicine

## 2012-12-21 ENCOUNTER — Emergency Department (HOSPITAL_COMMUNITY): Payer: BC Managed Care – PPO

## 2012-12-21 ENCOUNTER — Emergency Department (HOSPITAL_COMMUNITY)
Admission: EM | Admit: 2012-12-21 | Discharge: 2012-12-21 | Disposition: A | Discharge status: Home / Self Care | Payer: BC Managed Care – PPO | Attending: Emergency Medicine | Admitting: Emergency Medicine

## 2012-12-21 DIAGNOSIS — Z794 Long term (current) use of insulin: Secondary | ICD-10-CM | POA: Insufficient documentation

## 2012-12-21 DIAGNOSIS — R112 Nausea with vomiting, unspecified: Secondary | ICD-10-CM

## 2012-12-21 DIAGNOSIS — R197 Diarrhea, unspecified: Secondary | ICD-10-CM | POA: Insufficient documentation

## 2012-12-21 DIAGNOSIS — I2510 Atherosclerotic heart disease of native coronary artery without angina pectoris: Secondary | ICD-10-CM | POA: Insufficient documentation

## 2012-12-21 DIAGNOSIS — Z7982 Long term (current) use of aspirin: Secondary | ICD-10-CM | POA: Insufficient documentation

## 2012-12-21 DIAGNOSIS — Z87891 Personal history of nicotine dependence: Secondary | ICD-10-CM | POA: Insufficient documentation

## 2012-12-21 DIAGNOSIS — R5381 Other malaise: Secondary | ICD-10-CM | POA: Insufficient documentation

## 2012-12-21 DIAGNOSIS — R109 Unspecified abdominal pain: Secondary | ICD-10-CM | POA: Insufficient documentation

## 2012-12-21 DIAGNOSIS — Z79899 Other long term (current) drug therapy: Secondary | ICD-10-CM | POA: Insufficient documentation

## 2012-12-21 DIAGNOSIS — E119 Type 2 diabetes mellitus without complications: Secondary | ICD-10-CM | POA: Insufficient documentation

## 2012-12-21 DIAGNOSIS — R6883 Chills (without fever): Secondary | ICD-10-CM | POA: Insufficient documentation

## 2012-12-21 LAB — CBC WITH DIFFERENTIAL/PLATELET
Basophils Absolute: 0.1 K/uL (ref 0.0–0.1)
Basophils Relative: 1 % (ref 0–1)
Eosinophils Absolute: 0.1 K/uL (ref 0.0–0.7)
Eosinophils Relative: 2 % (ref 0–5)
HCT: 40.1 % (ref 36.0–46.0)
Hemoglobin: 13.8 g/dL (ref 12.0–15.0)
Lymphocytes Relative: 26 % (ref 12–46)
Lymphs Abs: 1.9 10*3/uL (ref 0.7–4.0)
MCH: 31.7 pg (ref 26.0–34.0)
MCHC: 34.4 g/dL (ref 30.0–36.0)
MCV: 92 fL (ref 78.0–100.0)
Monocytes Absolute: 0.5 K/uL (ref 0.1–1.0)
Monocytes Relative: 7 % (ref 3–12)
Neutro Abs: 4.7 10*3/uL (ref 1.7–7.7)
Neutrophils Relative %: 65 % (ref 43–77)
Platelets: 235 10*3/uL (ref 150–400)
RBC: 4.36 MIL/uL (ref 3.87–5.11)
RDW: 12.6 % (ref 11.5–15.5)
WBC: 7.3 10*3/uL (ref 4.0–10.5)

## 2012-12-21 LAB — COMPREHENSIVE METABOLIC PANEL
ALT: 23 U/L (ref 0–35)
Alkaline Phosphatase: 85 U/L (ref 39–117)
CO2: 24 mEq/L (ref 19–32)
Chloride: 96 mEq/L (ref 96–112)
GFR calc Af Amer: >90 mL/min (ref >90)
Glucose, Bld: 170 mg/dL — ABNORMAL HIGH (ref 70–99)
Potassium: 4 mEq/L (ref 3.5–5.1)
Sodium: 138 mEq/L (ref 135–145)
Total Bilirubin: 0.6 mg/dL (ref 0.3–1.2)
Total Protein: 8.2 g/dL (ref 6.0–8.3)

## 2012-12-21 LAB — URINALYSIS, ROUTINE W REFLEX MICROSCOPIC
Glucose, UA: >1000 mg/dL — AB
Hgb urine dipstick: NEGATIVE
Ketones, ur: >80 mg/dL — AB
Leukocytes, UA: NEGATIVE
Nitrite: NEGATIVE
Protein, ur: NEGATIVE mg/dL
Specific Gravity, Urine: 1.03 (ref 1.005–1.030)
Urobilinogen, UA: 0.2 mg/dL (ref 0.0–1.0)
pH: 5.5 (ref 5.0–8.0)

## 2012-12-21 LAB — COMPREHENSIVE METABOLIC PANEL WITH GFR
AST: 18 U/L (ref 0–37)
Albumin: 4.4 g/dL (ref 3.5–5.2)
BUN: 22 mg/dL (ref 6–23)
Calcium: 10.4 mg/dL (ref 8.4–10.5)
Creatinine, Ser: 0.66 mg/dL (ref 0.50–1.10)
GFR calc non Af Amer: >90 mL/min (ref >90)

## 2012-12-21 LAB — URINE MICROSCOPIC-ADD ON

## 2012-12-21 LAB — GLUCOSE, CAPILLARY: Glucose-Capillary: 154 mg/dL — ABNORMAL HIGH (ref 70–99)

## 2012-12-21 LAB — LIPASE, BLOOD: Lipase: 8 U/L — ABNORMAL LOW (ref 11–59)

## 2012-12-21 MED ORDER — ONDANSETRON HCL 4 MG/2ML IJ SOLN: 4.0000 mg | Freq: Once | INTRAMUSCULAR | Status: DC

## 2012-12-21 MED ORDER — ONDANSETRON HCL 4 MG/2ML IJ SOLN
4.0000 mg | Freq: Once | INTRAMUSCULAR | Status: AC
  Administered 2012-12-21: 4 mg via INTRAVENOUS
  Filled 2012-12-21: qty 2

## 2012-12-21 MED ORDER — IOHEXOL 300 MG/ML  SOLN
50.0000 mL | Freq: Once | INTRAMUSCULAR | Status: AC | PRN
  Administered 2012-12-21: 50 mL via ORAL

## 2012-12-21 MED ORDER — FENTANYL CITRATE 0.05 MG/ML IJ SOLN
50.0000 ug | Freq: Once | INTRAMUSCULAR | Status: AC
  Administered 2012-12-21: 50 ug via INTRAVENOUS
  Filled 2012-12-21: qty 2

## 2012-12-21 MED ORDER — PROMETHAZINE HCL 25 MG PO TABS: 25.0000 mg | ORAL_TABLET | Freq: Four times a day (QID) | ORAL | Status: DC | PRN

## 2012-12-21 MED ORDER — IOHEXOL 300 MG/ML  SOLN
100.0000 mL | Freq: Once | INTRAMUSCULAR | Status: AC | PRN
  Administered 2012-12-21: 100 mL via INTRAVENOUS

## 2012-12-21 NOTE — ED Notes (Signed)
Pt reports abdominal pain, LUQ, n/v/d x 4 days.

## 2012-12-21 NOTE — ED Provider Notes (Signed)
CSN: [id number]     Arrival date & time 12/21/12  1146 History   First MD Initiated Contact with Patient 12/21/12 1220     Chief Complaint  Patient presents with  . Diarrhea  . Abdominal Pain  . Emesis   (Consider location/radiation/quality/duration/timing/severity/associated sxs/prior Treatment) Patient is a 62 y.o. female presenting with diarrhea, abdominal pain, and vomiting. The history is provided by the patient and the spouse.  Diarrhea Quality:  Semi-solid Severity:  Severe Onset quality:  Gradual Number of episodes:  10-12 per day Duration:  5 days Timing:  Constant Progression:  Worsening Associated symptoms: abdominal pain, chills and vomiting   Associated symptoms: no fever   Abdominal pain:    Location:  LUQ, LLQ and L flank   Quality:  Aching   Severity:  Moderate   Onset quality:  Gradual   Duration:  6 months   Timing:  Sporadic   Progression:  Worsening   Chronicity:  Chronic Vomiting:    Severity:  Mild   Duration:  1 day   Timing:  Intermittent   Progression:  Unchanged Risk factors: sick contacts   Risk factors: no recent antibiotic use, no suspicious food intake and no travel to endemic areas   Risk factors comment:  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 Diabetes mellitus without complication   . Coronary artery disease    Past Surgical History  Procedure Laterality Date  . Cardiac surgery    . Incision and drainage  11/20/2011    Procedure: INCISION AND DRAINAGE;  Surgeon: Tami Ribas, MD;  Location: [facility];  Service: Orthopedics;  Laterality: Left;  INCISION AND DRAINAGE LEFT THUMB    History reviewed. No pertinent family history. History  Substance Use  Topics  . Smoking status: Former Games developer  . Smokeless tobacco: Not on file  . Alcohol Use: Yes   OB History   Grav Para Term Preterm Abortions TAB SAB Ect Mult Living                 Review of Systems  Constitutional: Positive for chills, appetite change and fatigue. Negative for fever.  Respiratory: Negative for chest tightness and shortness of breath.   Cardiovascular: Negative for chest pain.  Gastrointestinal: Positive for vomiting, abdominal pain and diarrhea. Negative for blood in stool.  Genitourinary: Positive for flank pain. Negative for dysuria.  Musculoskeletal: Negative for back pain.  Neurological: Positive for weakness. Negative for syncope, light-headedness and numbness.  All other systems reviewed and are negative.    Allergies  Demerol and Lomotil  Home Medications   Current Outpatient Rx  Name  Route  Sig  Dispense  Refill  . aspirin 325 MG tablet   Oral   Take 325 mg by mouth daily at 12 noon.         Marland Kitchen FLUoxetine (PROZAC) 20 MG capsule   Oral   Take 20 mg by mouth daily.         . injection device for insulin (HUMAPEN LUXURA HD) DEVI   Other   1-5 Units by Other route 3 (three) times daily. Sliding scale         . insulin glargine (LANTUS) 100 UNIT/ML injection  Subcutaneous   Inject 6 Units into the skin 2 (two) times daily.          Marland Kitchen loperamide (IMODIUM A-D) 2 MG tablet   Oral   Take 2 mg by mouth 4 (four) times daily as needed for diarrhea or loose stools.         . ondansetron (ZOFRAN) 8 MG tablet   Oral   Take 1 tablet (8 mg total) by mouth every 8 (eight) hours as needed for nausea.   20 tablet   0   . pantoprazole (PROTONIX) 20 MG tablet   Oral   Take 20 mg by mouth 2 (two) times daily.         . rosuvastatin (CRESTOR) 20 MG tablet   Oral   Take 20 mg by mouth daily.         . valsartan (DIOVAN) 40 MG tablet   Oral   Take 20 mg by mouth daily.         . promethazine (PHENERGAN) 25 MG tablet   Oral   Take  1 tablet (25 mg total) by mouth every 6 (six) hours as needed for nausea or vomiting.   20 tablet   0    BP 108/57  Pulse 74  Temp(Src) 98.5 F (36.9 C) (Oral)  Resp 20  SpO2 97% Physical Exam  Nursing note and vitals reviewed. Constitutional: She is oriented to person, place, and time. She appears well-developed and well-nourished. No distress.  HENT:  Head: Normocephalic and atraumatic.  Eyes: EOM are normal. No scleral icterus.  Neck: Normal range of motion. Neck supple.  Cardiovascular: Normal rate, regular rhythm and intact distal pulses.   Pulmonary/Chest: Effort normal and breath sounds normal. She has no wheezes. She has no rales.  Abdominal: Soft. Normal appearance. She exhibits no distension. Bowel sounds are increased. There is tenderness in the left upper quadrant and left lower quadrant. There is no rebound, no guarding, no CVA tenderness, no tenderness at McBurney's point and negative Murphy's sign.  Neurological: She is alert and oriented to person, place, and time. Coordination normal.  Skin: Skin is warm and dry. She is not diaphoretic.  Psychiatric: She has a normal mood and affect.    ED Course  Procedures (including critical care time) Labs Review Labs Reviewed  COMPREHENSIVE METABOLIC PANEL - Abnormal; Notable for the following:    Glucose, Bld 170 (*)    All other components within normal limits  LIPASE, BLOOD - Abnormal; Notable for the following:    Lipase 8 (*)    All other components within normal limits  GLUCOSE, CAPILLARY - Abnormal; Notable for the following:    Glucose-Capillary 154 (*)    All other components within normal limits  URINALYSIS, ROUTINE W REFLEX MICROSCOPIC - Abnormal; Notable for the following:    Glucose, UA >1000 (*)    Bilirubin Urine MODERATE (*)    Ketones, ur >80 (*)    All other components within normal limits  URINE MICROSCOPIC-ADD ON - Abnormal; Notable for the following:    Bacteria, UA FEW (*)    Casts HYALINE  CASTS (*)    All other components within normal limits  CBC WITH DIFFERENTIAL  GI PATHOGEN PANEL BY PCR, STOOL   Imaging Review Ct Abdomen Pelvis W Contrast  12/21/2012   CLINICAL DATA:  Left upper quadrant pain  EXAM: CT ABDOMEN AND PELVIS WITH CONTRAST  TECHNIQUE: Multidetector CT imaging of the abdomen and pelvis was performed using the standard  protocol following bolus administration of intravenous contrast.  CONTRAST:  50mL OMNIPAQUE IOHEXOL 300 MG/ML SOLN, OMNIPAQUE IOHEXOL 300 MG/ML SOLN  COMPARISON:  08/19/2012  FINDINGS: Lung bases are free of acute infiltrate or sizable effusion.  The gallbladder is been surgically removed. The liver, spleen, adrenal glands and pancreas are normal in appearance. The kidneys are well visualized bilaterally. A left upper pole 5 mm stone is again identified and stable. No obstructive changes are seen.  The bladder is decompressed. No pelvic sidewall abnormality is noted. The uterus is been surgically removed. The appendix is not well visualized and may been surgically removed with the uterus. No inflammatory changes are seen. The osseous structures show degenerative change of the lumbar spine. No acute bony abnormality is noted.  IMPRESSION: No acute abnormality is identified.  Stable left upper pole renal stone.   Electronically Signed   By: Alcide Clever M.D.   On: 12/21/2012 14:35    EKG Interpretation   None      RA sat is 100% and I interpret to be normal  3:51 PM Pt has had no diarrhea or emesis in the ED.  Abd is soft.  I reviewed CT and discussed with pt and family.  Will try PO's.  I spoke to Dr. Ewing Schlein who will send Dr. Matthias Hughs a message that pt may benefit from close follow up.  Rx for nausea meds.  Pt has imodium and phenergan suppositories at home already.   MDM   1. Nausea vomiting and diarrhea      Pt with change in stools over a few months time, increase in pain, diarrhea and had emesis since yesterday.  Abd is soft, with mild  tenderness on left side.  WBC is normal, no fever. Given age, left side pain, would get CT to r./o diverticulitis.  Otherwise IVF's and IV symptom control.        Gavin Pound. Davius Goudeau, MD 12/21/12 1551

## 2013-04-08 ENCOUNTER — Ambulatory Visit (INDEPENDENT_AMBULATORY_CARE_PROVIDER_SITE_OTHER): Payer: BC Managed Care – PPO | Admitting: Ophthalmology

## 2013-06-12 ENCOUNTER — Emergency Department (HOSPITAL_COMMUNITY)
Admission: EM | Admit: 2013-06-12 | Discharge: 2013-06-13 | Disposition: A | Discharge status: Home / Self Care | Payer: 59 | Attending: Emergency Medicine | Admitting: Emergency Medicine

## 2013-06-12 ENCOUNTER — Ambulatory Visit (INDEPENDENT_AMBULATORY_CARE_PROVIDER_SITE_OTHER): Payer: 59 | Admitting: Ophthalmology

## 2013-06-12 DIAGNOSIS — Z87891 Personal history of nicotine dependence: Secondary | ICD-10-CM | POA: Insufficient documentation

## 2013-06-12 DIAGNOSIS — E860 Dehydration: Secondary | ICD-10-CM | POA: Insufficient documentation

## 2013-06-12 DIAGNOSIS — R739 Hyperglycemia, unspecified: Secondary | ICD-10-CM

## 2013-06-12 DIAGNOSIS — R110 Nausea: Secondary | ICD-10-CM | POA: Insufficient documentation

## 2013-06-12 DIAGNOSIS — E1139 Type 2 diabetes mellitus with other diabetic ophthalmic complication: Secondary | ICD-10-CM

## 2013-06-12 DIAGNOSIS — Z7982 Long term (current) use of aspirin: Secondary | ICD-10-CM | POA: Insufficient documentation

## 2013-06-12 DIAGNOSIS — Z79899 Other long term (current) drug therapy: Secondary | ICD-10-CM | POA: Insufficient documentation

## 2013-06-12 DIAGNOSIS — E119 Type 2 diabetes mellitus without complications: Secondary | ICD-10-CM

## 2013-06-12 DIAGNOSIS — Z794 Long term (current) use of insulin: Secondary | ICD-10-CM | POA: Insufficient documentation

## 2013-06-12 DIAGNOSIS — H33309 Unspecified retinal break, unspecified eye: Secondary | ICD-10-CM

## 2013-06-12 DIAGNOSIS — Z9889 Other specified postprocedural states: Secondary | ICD-10-CM | POA: Insufficient documentation

## 2013-06-12 DIAGNOSIS — R1012 Left upper quadrant pain: Secondary | ICD-10-CM | POA: Insufficient documentation

## 2013-06-12 DIAGNOSIS — I2510 Atherosclerotic heart disease of native coronary artery without angina pectoris: Secondary | ICD-10-CM | POA: Insufficient documentation

## 2013-06-12 DIAGNOSIS — H2510 Age-related nuclear cataract, unspecified eye: Secondary | ICD-10-CM

## 2013-06-12 DIAGNOSIS — E1310 Other specified diabetes mellitus with ketoacidosis without coma: Secondary | ICD-10-CM | POA: Insufficient documentation

## 2013-06-12 DIAGNOSIS — E1165 Type 2 diabetes mellitus with hyperglycemia: Secondary | ICD-10-CM

## 2013-06-12 DIAGNOSIS — E11319 Type 2 diabetes mellitus with unspecified diabetic retinopathy without macular edema: Secondary | ICD-10-CM

## 2013-06-12 DIAGNOSIS — H43819 Vitreous degeneration, unspecified eye: Secondary | ICD-10-CM

## 2013-06-13 ENCOUNTER — Encounter (HOSPITAL_COMMUNITY): Payer: Self-pay | Admitting: Emergency Medicine

## 2013-06-13 LAB — CBC WITH DIFFERENTIAL/PLATELET
BASOS PCT: 1 % (ref 0–1)
Basophils Absolute: 0.1 10*3/uL (ref 0.0–0.1)
EOS ABS: 0.2 10*3/uL (ref 0.0–0.7)
Eosinophils Relative: 4 % (ref 0–5)
HCT: 36.4 % (ref 36.0–46.0)
HEMOGLOBIN: 12.2 g/dL (ref 12.0–15.0)
LYMPHS ABS: 2 10*3/uL (ref 0.7–4.0)
Lymphocytes Relative: 35 % (ref 12–46)
MCH: 29.5 pg (ref 26.0–34.0)
MCHC: 33.5 g/dL (ref 30.0–36.0)
MCV: 87.9 fL (ref 78.0–100.0)
Monocytes Absolute: 0.5 10*3/uL (ref 0.1–1.0)
Monocytes Relative: 9 % (ref 3–12)
NEUTROS PCT: 51 % (ref 43–77)
Neutro Abs: 3 10*3/uL (ref 1.7–7.7)
PLATELETS: 198 10*3/uL (ref 150–400)
RBC: 4.14 MIL/uL (ref 3.87–5.11)
RDW: 12.4 % (ref 11.5–15.5)
WBC: 5.8 10*3/uL (ref 4.0–10.5)

## 2013-06-13 LAB — BLOOD GAS, VENOUS
Acid-base deficit: 0.8 mmol/L (ref 0.0–2.0)
Bicarbonate: 23.9 mEq/L (ref 20.0–24.0)
FIO2: 0.21 %
O2 Saturation: 82.4 %
PATIENT TEMPERATURE: 98.6
PCO2 VEN: 41.8 mmHg — AB (ref 45.0–50.0)
TCO2: 21.7 mmol/L (ref 0–100)
pH, Ven: 7.375 — ABNORMAL HIGH (ref 7.250–7.300)
pO2, Ven: 46.7 mmHg — ABNORMAL HIGH (ref 30.0–45.0)

## 2013-06-13 LAB — CBG MONITORING, ED
GLUCOSE-CAPILLARY: 109 mg/dL — AB (ref 70–99)
GLUCOSE-CAPILLARY: 243 mg/dL — AB (ref 70–99)
Glucose-Capillary: 296 mg/dL — ABNORMAL HIGH (ref 70–99)
Glucose-Capillary: 34 mg/dL — CL (ref 70–99)
Glucose-Capillary: 35 mg/dL — CL (ref 70–99)
Glucose-Capillary: 501 mg/dL — ABNORMAL HIGH (ref 70–99)

## 2013-06-13 LAB — URINALYSIS, ROUTINE W REFLEX MICROSCOPIC
Bilirubin Urine: NEGATIVE
Glucose, UA: >1000 mg/dL — AB
Hgb urine dipstick: NEGATIVE
KETONES UR: NEGATIVE mg/dL
LEUKOCYTES UA: NEGATIVE
Nitrite: NEGATIVE
PROTEIN: NEGATIVE mg/dL
Specific Gravity, Urine: 1.013 (ref 1.005–1.030)
UROBILINOGEN UA: 1 mg/dL (ref 0.0–1.0)
pH: 6 (ref 5.0–8.0)

## 2013-06-13 LAB — BASIC METABOLIC PANEL
BUN: 14 mg/dL (ref 6–23)
CO2: 25 mEq/L (ref 19–32)
Calcium: 9.7 mg/dL (ref 8.4–10.5)
Chloride: 98 mEq/L (ref 96–112)
Creatinine, Ser: 0.6 mg/dL (ref 0.50–1.10)
GFR calc Af Amer: >90 mL/min (ref >90)
Glucose, Bld: 503 mg/dL — ABNORMAL HIGH (ref 70–99)
Potassium: 3.9 mEq/L (ref 3.7–5.3)
SODIUM: 137 meq/L (ref 137–147)

## 2013-06-13 LAB — URINE MICROSCOPIC-ADD ON

## 2013-06-13 MED ORDER — LOPERAMIDE HCL 2 MG PO CAPS
4.0000 mg | ORAL_CAPSULE | Freq: Once | ORAL | Status: AC
  Administered 2013-06-13: 4 mg via ORAL
  Filled 2013-06-13: qty 2

## 2013-06-13 MED ORDER — INSULIN ASPART 100 UNIT/ML IV SOLN
5.0000 [IU] | Freq: Once | INTRAVENOUS | Status: AC
  Administered 2013-06-13: 5 [IU] via INTRAVENOUS
  Filled 2013-06-13: qty 0.05

## 2013-06-13 MED ORDER — ONDANSETRON HCL 4 MG/2ML IJ SOLN
4.0000 mg | Freq: Once | INTRAMUSCULAR | Status: AC
  Administered 2013-06-13: 4 mg via INTRAVENOUS
  Filled 2013-06-13: qty 2

## 2013-06-13 MED ORDER — DEXTROSE 50 % IV SOLN
25.0000 mL | Freq: Once | INTRAVENOUS | Status: AC
Start: 2013-06-13 — End: 2013-06-13
  Administered 2013-06-13: 25 mL via INTRAVENOUS
  Filled 2013-06-13: qty 50

## 2013-06-13 MED ORDER — INSULIN GLARGINE 100 UNIT/ML ~~LOC~~ SOLN
14.0000 [IU] | Freq: Once | SUBCUTANEOUS | Status: AC
  Administered 2013-06-13: 14 [IU] via SUBCUTANEOUS
  Filled 2013-06-13: qty 0.14

## 2013-06-13 MED ORDER — SODIUM CHLORIDE 0.9 % IV BOLUS (SEPSIS)
1000.0000 mL | Freq: Once | INTRAVENOUS | Status: AC
  Administered 2013-06-13: 1000 mL via INTRAVENOUS

## 2013-06-13 NOTE — ED Provider Notes (Signed)
CSN: [id number]     Arrival date & time 06/12/13  2350 History   First MD Initiated Contact with Patient 06/13/13 0011     Chief Complaint  Patient presents with  . Hyperglycemia     (Consider location/radiation/quality/duration/timing/severity/associated sxs/prior Treatment) HPI Comments: 63 year old female with uncontrolled diabetes history, DKA, cholesterol presents with hyperglycemia. This morning glucose was in the 100s and gradually through the day her meter has sent to high which means given 600. Patient has had increased urination and mild increased thirst. Patient has not eaten regularly today is normal. No recent infection at all or change in medication. Patient normally takes Lantus 14 in the morning and 12 at night. She is on a sliding scale of Humalog and took extra 12 units followed by 8 units at 10:30 and 11 respectively tonight. Patient denies other symptoms including vomiting, headache , diarrhea, fevers chills or recent infection. Patient is visiting for a graduation. No recent prednisone  Patient is a 63 y.o. female presenting with hyperglycemia. The history is provided by the patient.  Hyperglycemia Associated symptoms: increased thirst and polyuria   Associated symptoms: no abdominal pain, no chest pain, no dysuria, no fever, no shortness of breath and no vomiting     Past Medical History  Diagnosis Date  . Diabetes mellitus without complication   . Coronary artery disease    Past Surgical History  Procedure Laterality Date  . Cardiac surgery    . Incision and drainage  11/20/2011    Procedure: INCISION AND DRAINAGE;  Surgeon: Tami RibasKevin R Kuzma, MD;  Location: [facility];  Service: Orthopedics;  Laterality: Left;  INCISION AND DRAINAGE LEFT THUMB    No family history on file. History  Substance Use Topics  . Smoking status: Former Games developermoker  . Smokeless tobacco: Not on file  . Alcohol Use: Yes   OB History   Grav Para Term Preterm Abortions TAB SAB Ect  Mult Living                 Review of Systems  Constitutional: Negative for fever and chills.  HENT: Negative for congestion.   Eyes: Negative for visual disturbance.  Respiratory: Negative for shortness of breath.   Cardiovascular: Negative for chest pain.  Gastrointestinal: Negative for vomiting and abdominal pain.  Endocrine: Positive for polydipsia and polyuria.  Genitourinary: Negative for dysuria and flank pain.  Musculoskeletal: Negative for back pain, neck pain and neck stiffness.  Skin: Negative for rash.  Neurological: Negative for light-headedness and headaches.      Allergies  Demerol and Lomotil  Home Medications   Prior to Admission medications   Medication Sig Start Date End Date Taking? Authorizing Provider  aspirin 325 MG tablet Take 325 mg by mouth daily at 12 noon.    Historical Provider, MD  FLUoxetine (PROZAC) 20 MG capsule Take 20 mg by mouth daily.    Historical Provider, MD  injection device for insulin (HUMAPEN LUXURA HD) DEVI 1-5 Units by Other route 3 (three) times daily. Sliding scale    Historical Provider, MD  insulin glargine (LANTUS) 100 UNIT/ML injection Inject 6 Units into the skin 2 (two) times daily.     Historical Provider, MD  loperamide (IMODIUM A-D) 2 MG tablet Take 2 mg by mouth 4 (four) times daily as needed for diarrhea or loose stools.    Historical Provider, MD  ondansetron (ZOFRAN) 8 MG tablet Take 1 tablet (8 mg total) by mouth every 8 (eight) hours as needed for  nausea. 08/19/12   Flint Melter, MD  pantoprazole (PROTONIX) 20 MG tablet Take 20 mg by mouth 2 (two) times daily.    Historical Provider, MD  promethazine (PHENERGAN) 25 MG tablet Take 1 tablet (25 mg total) by mouth every 6 (six) hours as needed for nausea or vomiting. 12/21/12   Gavin Pound. Ghim, MD  rosuvastatin (CRESTOR) 20 MG tablet Take 20 mg by mouth daily.    Historical Provider, MD  valsartan (DIOVAN) 40 MG tablet Take 20 mg by mouth daily.    Historical Provider,  MD   BP 175/104  Pulse 95  Temp(Src) 97.7 F (36.5 C) (Oral)  Resp 18  Ht 5\' 2"  (1.575 m)  Wt 145 lb (65.772 kg)  BMI 26.51 kg/m2 Physical Exam  Nursing note and vitals reviewed. Constitutional: She is oriented to person, place, and time. She appears well-developed and well-nourished.  HENT:  Head: Normocephalic and atraumatic.  Dry mucous membranes  Eyes: Conjunctivae are normal. Right eye exhibits no discharge. Left eye exhibits no discharge.  Neck: Normal range of motion. Neck supple. No tracheal deviation present.  Cardiovascular: Normal rate and regular rhythm.   Pulmonary/Chest: Effort normal and breath sounds normal.  Abdominal: Soft. She exhibits no distension. There is tenderness (very mild left upper quadrant). There is no guarding.  Musculoskeletal: She exhibits no edema.  Neurological: She is alert and oriented to person, place, and time. No cranial nerve deficit.  Skin: Skin is warm. No rash noted.  Psychiatric: She has a normal mood and affect.    ED Course  Procedures (including critical care time) Labs Review Labs Reviewed  BASIC METABOLIC PANEL - Abnormal; Notable for the following:    Glucose, Bld 503 (*)    All other components within normal limits  BLOOD GAS, VENOUS - Abnormal; Notable for the following:    pH, Ven 7.375 (*)    pCO2, Ven 41.8 (*)    pO2, Ven 46.7 (*)    All other components within normal limits  URINALYSIS, ROUTINE W REFLEX MICROSCOPIC - Abnormal; Notable for the following:    Glucose, UA >1000 (*)    All other components within normal limits  CBG MONITORING, ED - Abnormal; Notable for the following:    Glucose-Capillary 501 (*)    All other components within normal limits  CBG MONITORING, ED - Abnormal; Notable for the following:    Glucose-Capillary 296 (*)    All other components within normal limits  CBG MONITORING, ED - Abnormal; Notable for the following:    Glucose-Capillary 34 (*)    All other components within normal  limits  CBG MONITORING, ED - Abnormal; Notable for the following:    Glucose-Capillary 35 (*)    All other components within normal limits  CBG MONITORING, ED - Abnormal; Notable for the following:    Glucose-Capillary 109 (*)    All other components within normal limits  CBG MONITORING, ED - Abnormal; Notable for the following:    Glucose-Capillary 243 (*)    All other components within normal limits  CBC WITH DIFFERENTIAL  URINE MICROSCOPIC-ADD ON    Imaging Review No results found.   EKG Interpretation None      MDM   Final diagnoses:  Hyperglycemia  Diabetes mellitus  Nausea  Dehydration   Patient with known diabetes history M.D. K. history presents with hyperglycemia.  Patient is well-appearing otherwise except for mild dehydration and neuro intact. Blood work and urine pending. Fluid bolus going.  Patient has  anion gap of 14. No ketones in the urine. Patient clinically does not have DKA is well-appearing. Patient's blood sugar in symptoms improved with IV fluids. Blood sugar in the 200s and small dose of insulin 5 units IV ordered. Patient rechecked and felt sweaty and sugar checked in the 30s. Patient not confused able to tolerate oral plus fruit and juice ordered. Patient sugar maintained in the 30s so small dextrose bolus given. A meal was ordered for the patient. Patient felt nausea and Zofran was given. Recheck abdominal exam benign. Ordered morning Lantus dose of the patient.  Patient finally early in the morning balanced often felt improved with glucose in the 200s. Close followup with her endocrinologist just and discharged.  Results and differential diagnosis were discussed with the patient/parent/guardian. Close follow up outpatient was discussed, comfortable with the plan.   Filed Vitals:   06/13/13 0006 06/13/13 0250 06/13/13 0453 06/13/13 0649  BP: 175/104 108/56 142/71 143/70  Pulse: 95 86 96 94  Temp: 97.7 F (36.5 C)     TempSrc: Oral      Resp: 18  16   Height: 5\' 2"  (1.575 m)     Weight: 145 lb (65.772 kg)     SpO2:  97% 98% 96%       Enid Skeens, MD 06/13/13 [phone]

## 2013-06-13 NOTE — ED Notes (Signed)
Pt states she now has diarrhea and usually takes up to 4 imodium for her loose stools,  This Clinical research associate will inform Dr Gardiner Rhyme

## 2013-06-13 NOTE — ED Notes (Addendum)
Pt states her BG has read high x 2 this evening. Pt states she is in town for graduation and has been in DKA many times in the past.  12 units of Humalog at 2240 8 units of Humalog at 2340

## 2013-06-13 NOTE — Discharge Instructions (Signed)
Please eat regular meals and check glucose regularly. Follow closely with your endocrinologist. Stay well-hydrated water.  If you were given medicines take as directed.  If you are on coumadin or contraceptives realize their levels and effectiveness is altered by many different medicines.  If you have any reaction (rash, tongues swelling, other) to the medicines stop taking and see a physician.   Please follow up as directed and return to the ER or see a physician for new or worsening symptoms.  Thank you. Filed Vitals:   06/13/13 0006 06/13/13 0250 06/13/13 0453  BP: 175/104 108/56 142/71  Pulse: 95 86 96  Temp: 97.7 F (36.5 C)    TempSrc: Oral    Resp: 18  16  Height: 5\' 2"  (1.575 m)    Weight: 145 lb (65.772 kg)    SpO2:  97% 98%

## 2014-06-28 ENCOUNTER — Ambulatory Visit (INDEPENDENT_AMBULATORY_CARE_PROVIDER_SITE_OTHER): Payer: 59 | Admitting: Ophthalmology

## 2014-07-01 ENCOUNTER — Ambulatory Visit (INDEPENDENT_AMBULATORY_CARE_PROVIDER_SITE_OTHER): Payer: Self-pay | Admitting: Ophthalmology

## 2014-07-22 ENCOUNTER — Ambulatory Visit (INDEPENDENT_AMBULATORY_CARE_PROVIDER_SITE_OTHER): Payer: Self-pay | Admitting: Ophthalmology

## 2015-04-09 IMAGING — CT CT HEAD W/O CM
2 series · 17 of 30 positions shown, 20 images · non-contrast
Comparison: None

CLINICAL DATA: Hypoglycemia.  Seizure

CT HEAD WITHOUT CONTRAST
TECHNIQUE: Contiguous axial images were obtained from the base of
the skull through the vertex without contrast.

[Series 2: head w/o · axial · non-contrast · 0.43mm/px · z∈[-241,-136]mm · 9 of 27 slices shown, 12 images]
[im 3/27  brain]
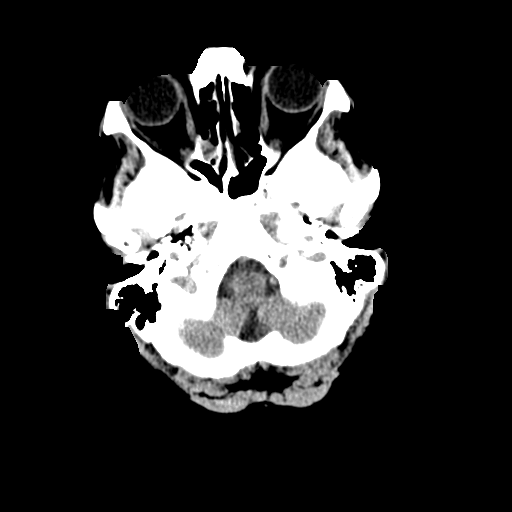
[im 3/27  bone]
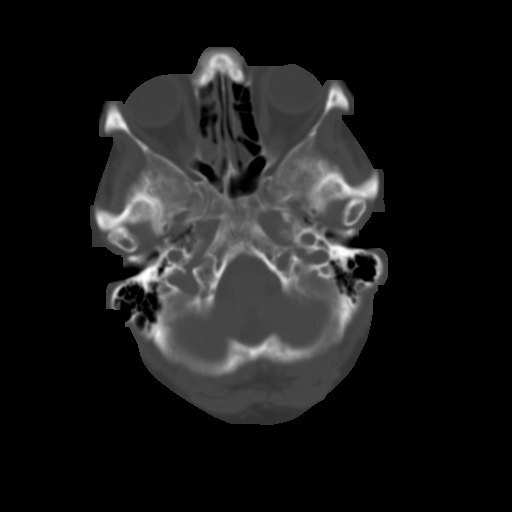
[im 6/27  brain]
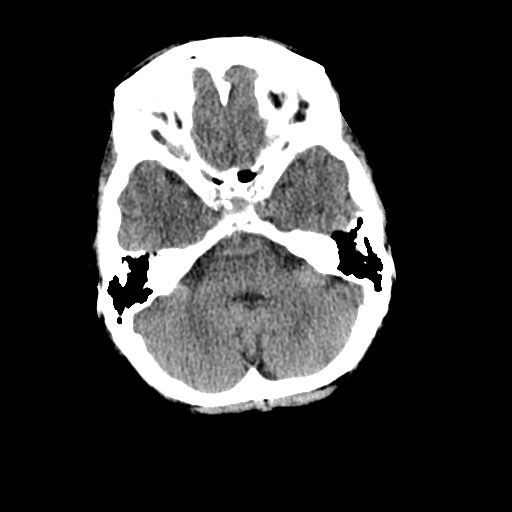
[im 8/27  brain]
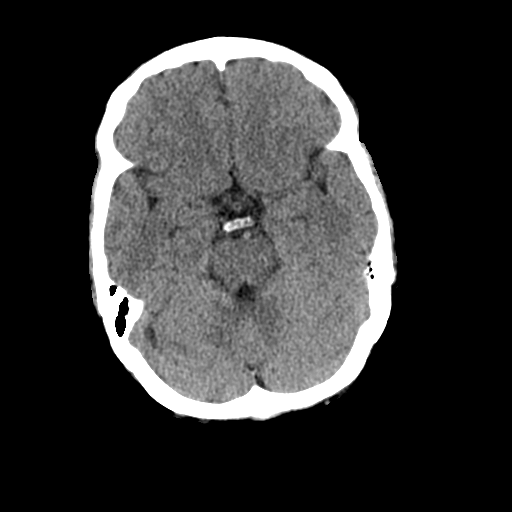
[im 11/27  brain]
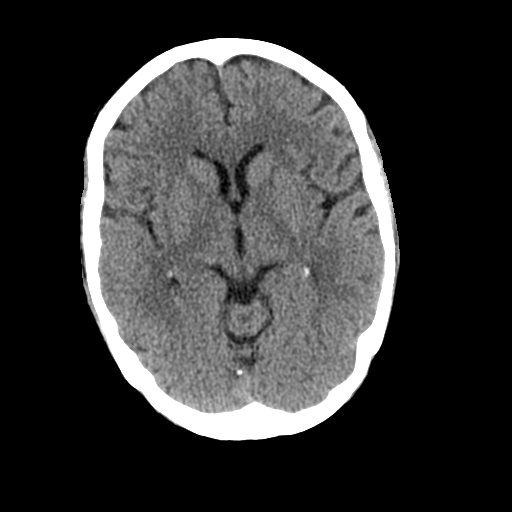
[im 14/27  brain]
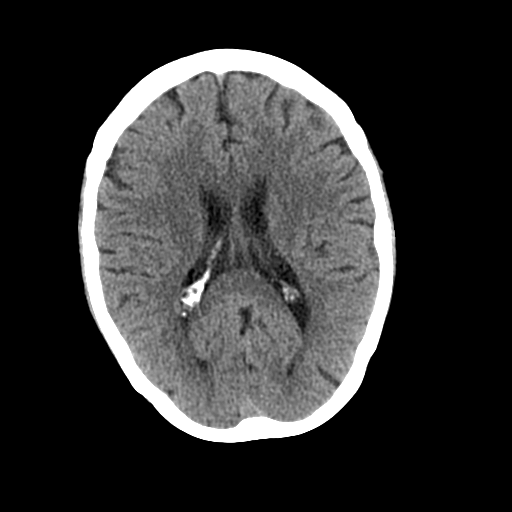
[im 14/27  bone]
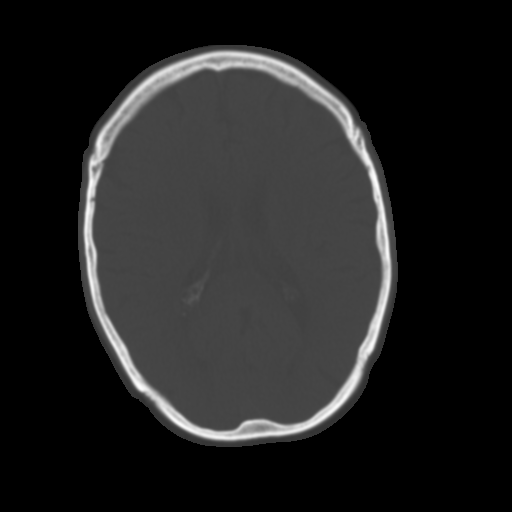
[im 16/27  brain]
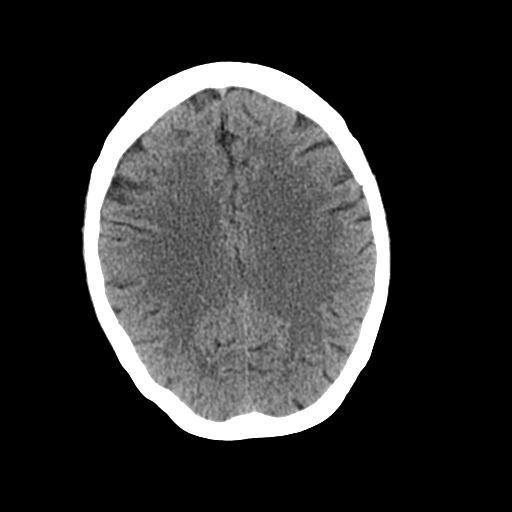
[im 19/27  brain]
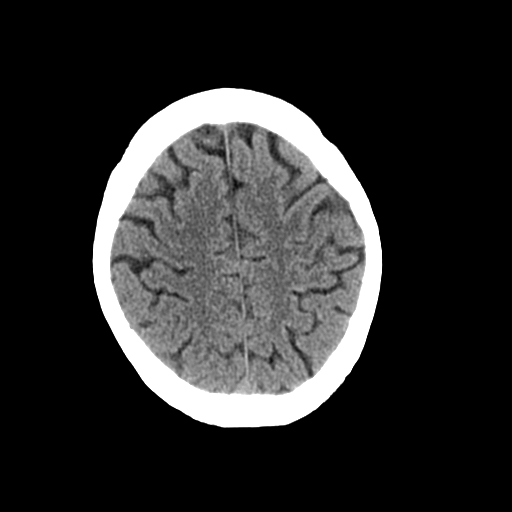
[im 21/27  brain]
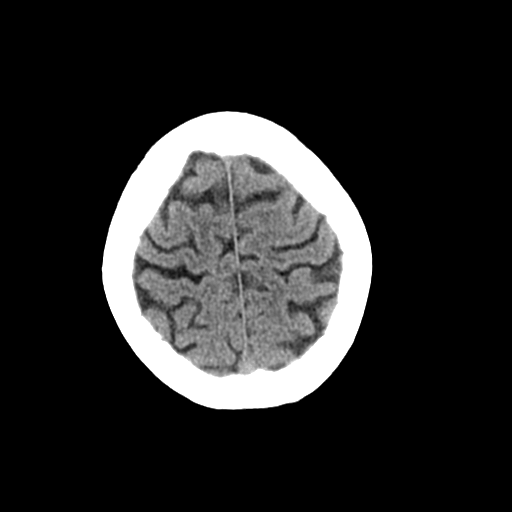
[im 24/27  brain]
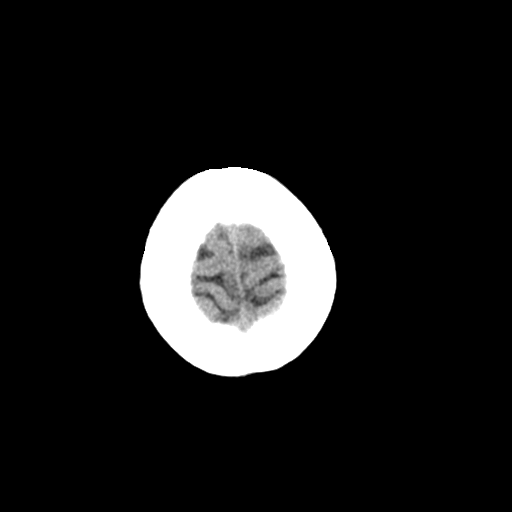
[im 24/27  bone]
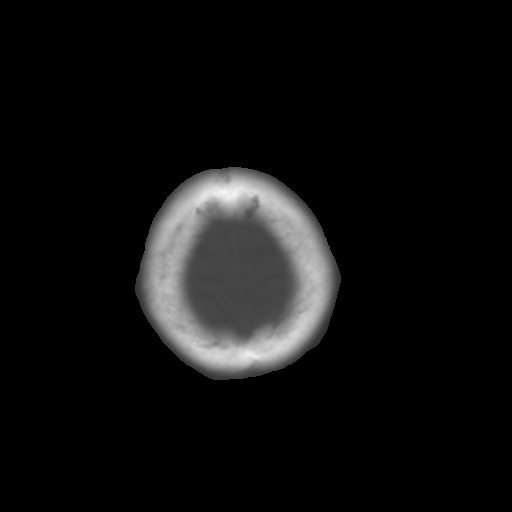

[Series 3: bone windows · axial · 0.43mm/px · z∈[-239,-134]mm · 8 of 45 slices shown]
[im 5/45  bone]
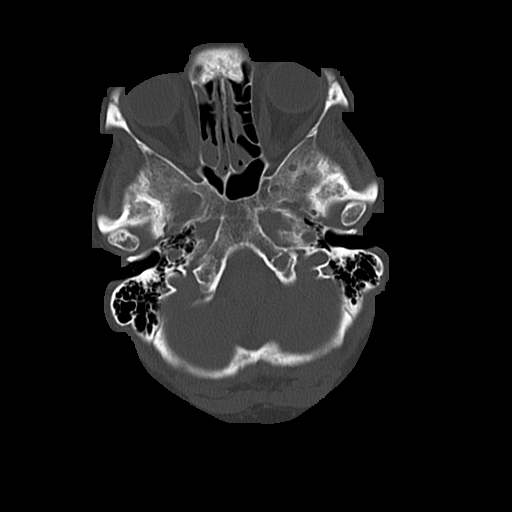
[im 10/45  bone]
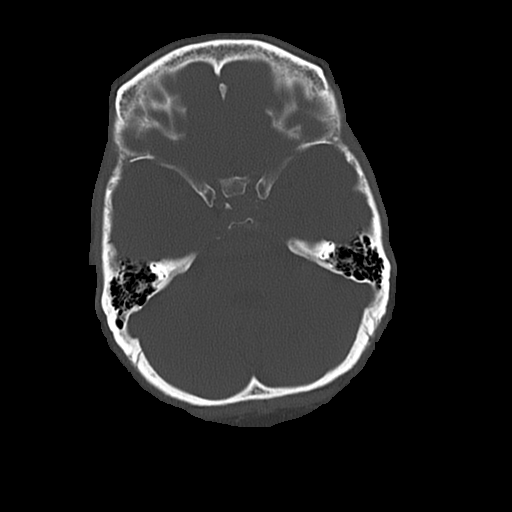
[im 15/45  bone]
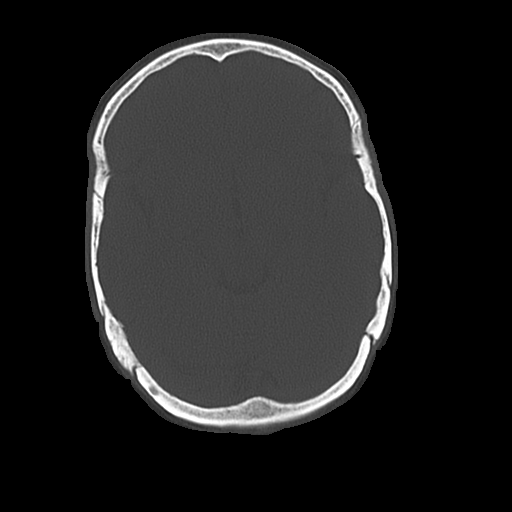
[im 20/45  bone]
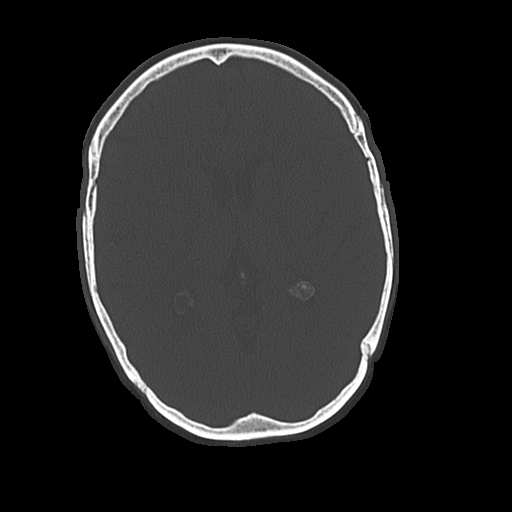
[im 25/45  bone]
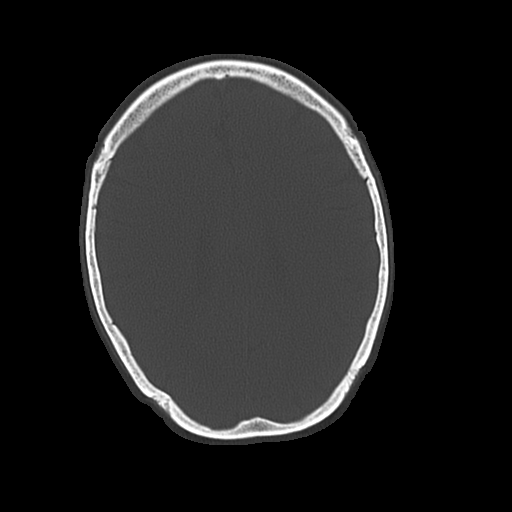
[im 30/45  bone]
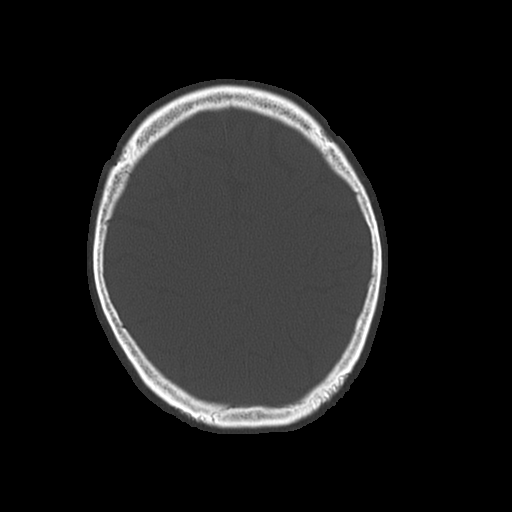
[im 35/45  bone]
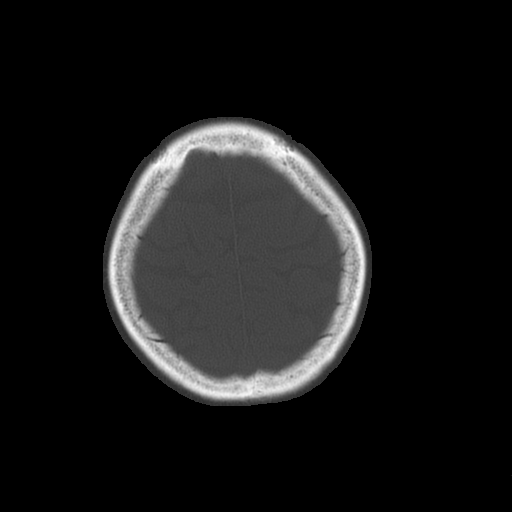
[im 40/45  bone]
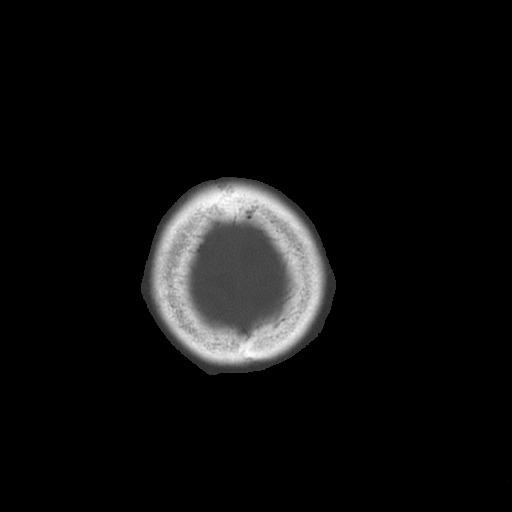

[17 of 30 positions shown; findings below may reference images not displayed]

FINDINGS: Ventricle size is normal.  Negative for acute or chronic
infarct.  Negative for hemorrhage or mass.  No midline shift.
Calvarium is intact.  There is mild mucosal edema in the paranasal
sinuses.
IMPRESSION: No acute abnormality.

## 2015-06-09 IMAGING — CT CT ABD-PELV W/ CM
1 of 3 series · 14 of 32 positions shown, 19 images · IV contrast (OMNIPAQUE 300)
Comparison: None.

CLINICAL DATA: left upper abdominal pain, nausea, vomiting, prior
cholecystectomy and hysterectomy.  History of nephrolithiasis

CT ABDOMEN AND PELVIS WITH CONTRAST
TECHNIQUE: Multidetector CT imaging of the abdomen and pelvis was
performed following the standard protocol during bolus
administration of intravenous contrast.
Contrast: 50mL OMNIPAQUE IOHEXOL 300 MG/ML  SOLN, 100mL OMNIPAQUE
IOHEXOL 300 MG/ML  SOLN

[Series 2: abd/pel with · axial · 0.69mm/px · z∈[-389,+11]mm · 14 of 90 slices shown, 19 images]
[im 5/90  soft-tissue]
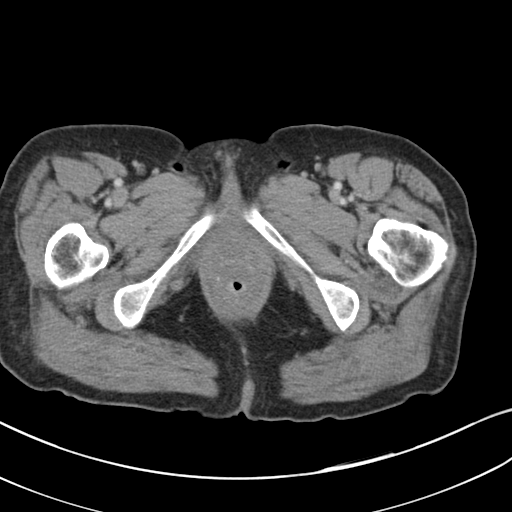
[im 5/90  bone]
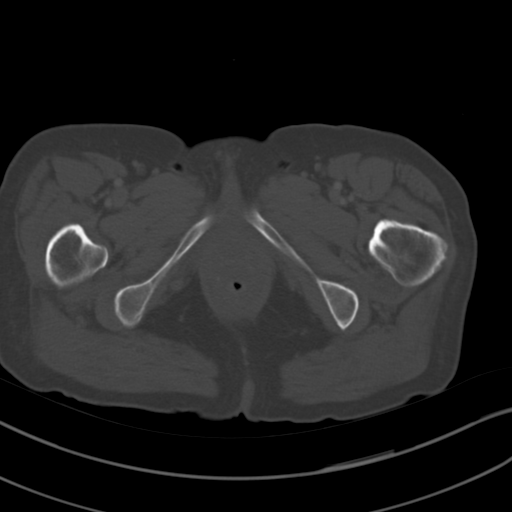
[im 10/90  soft-tissue]
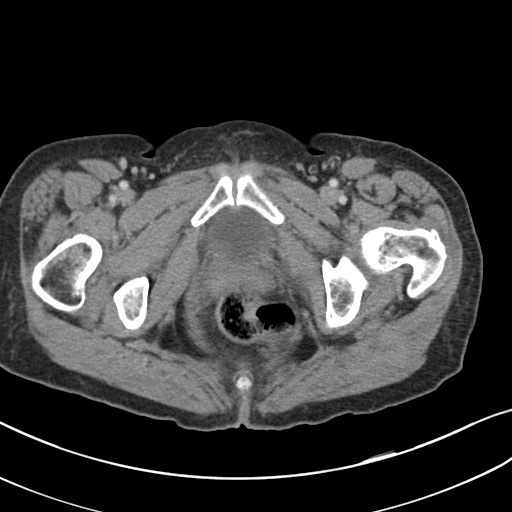
[im 20/90  soft-tissue]
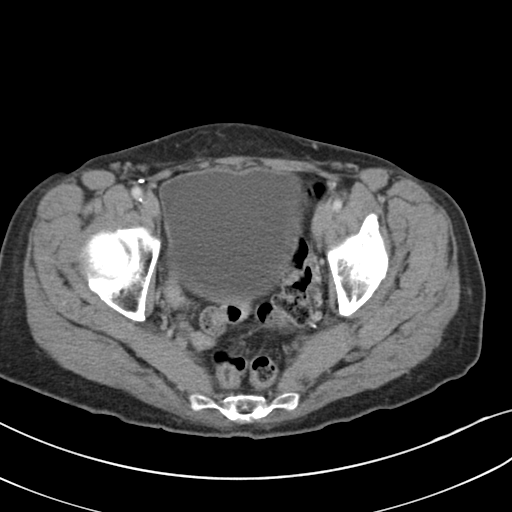
[im 25/90  soft-tissue]
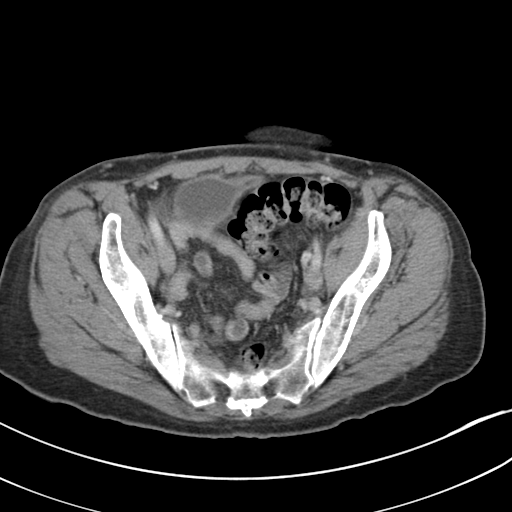
[im 30/90  soft-tissue]
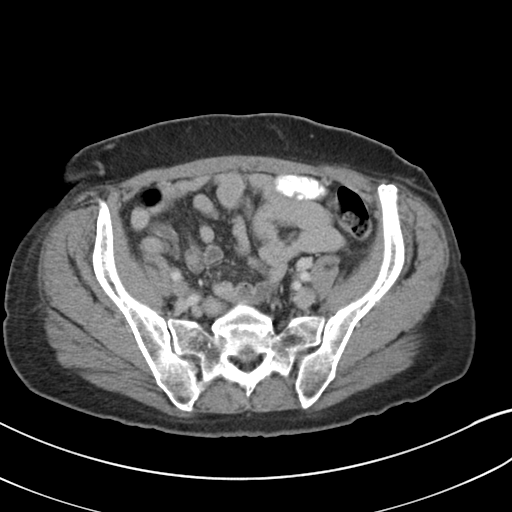
[im 40/90  soft-tissue]
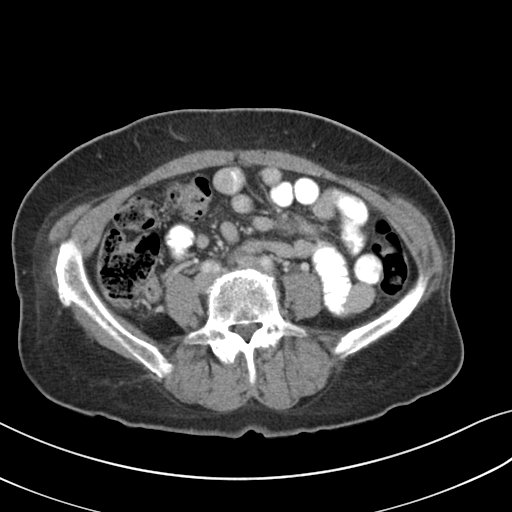
[im 45/90  soft-tissue]
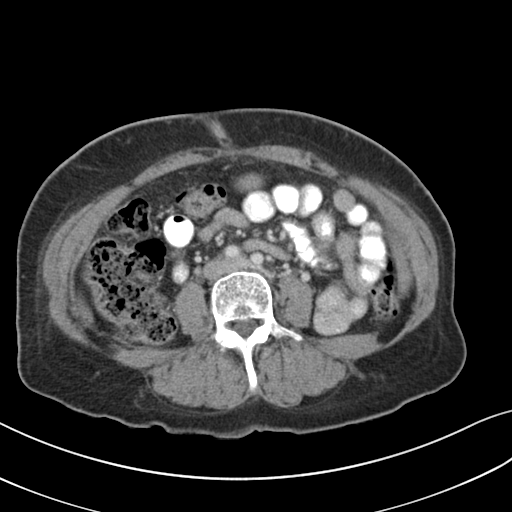
[im 50/90  soft-tissue]
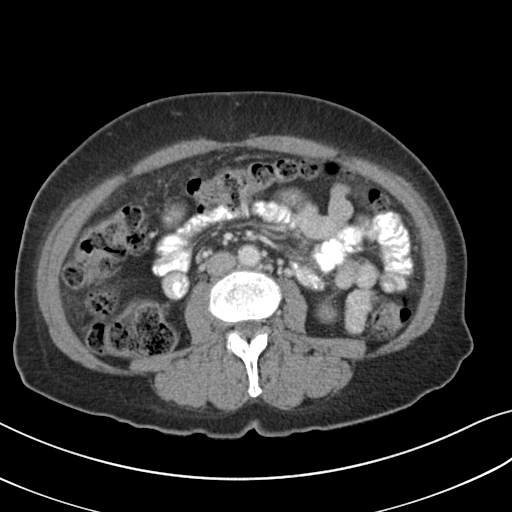
[im 60/90  soft-tissue]
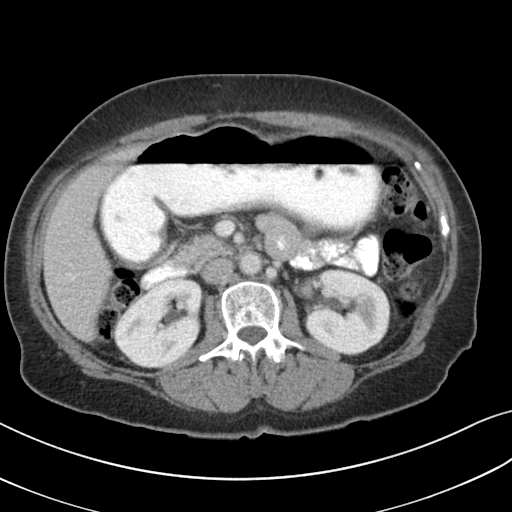
[im 60/90  bone]
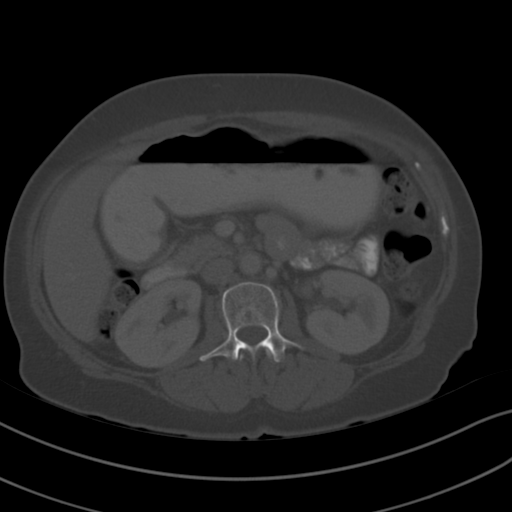
[im 65/90  soft-tissue]
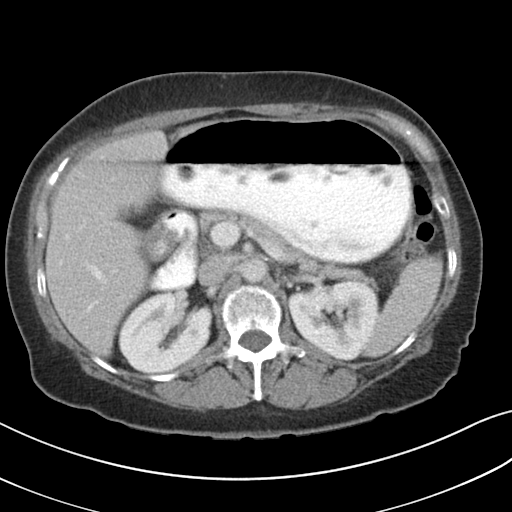
[im 70/90  soft-tissue]
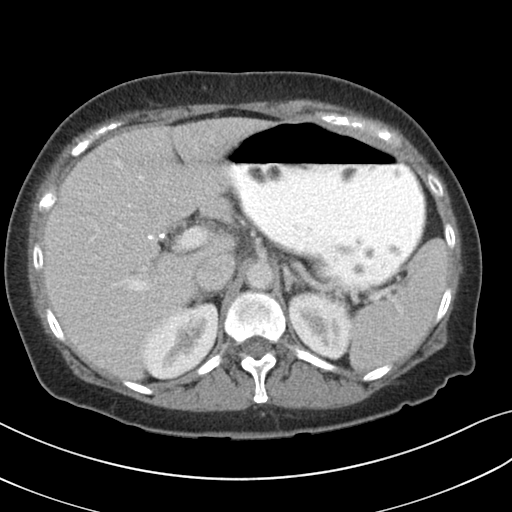
[im 70/90  lung]
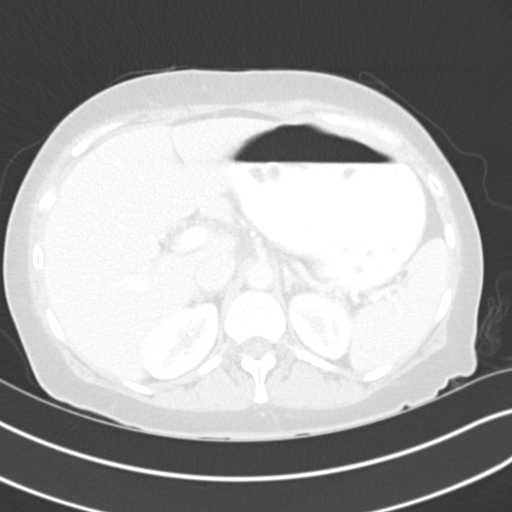
[im 75/90  lung]
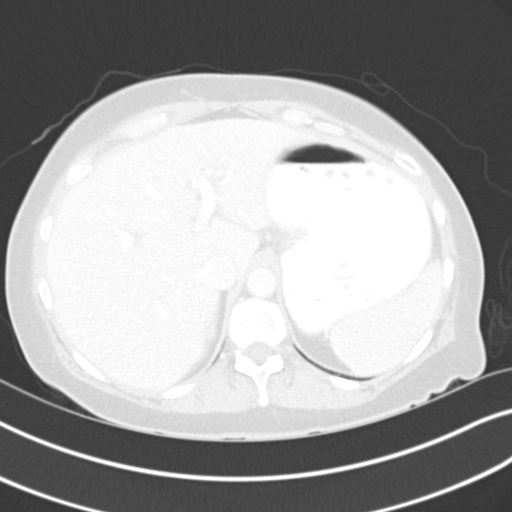
[im 80/90  soft-tissue]
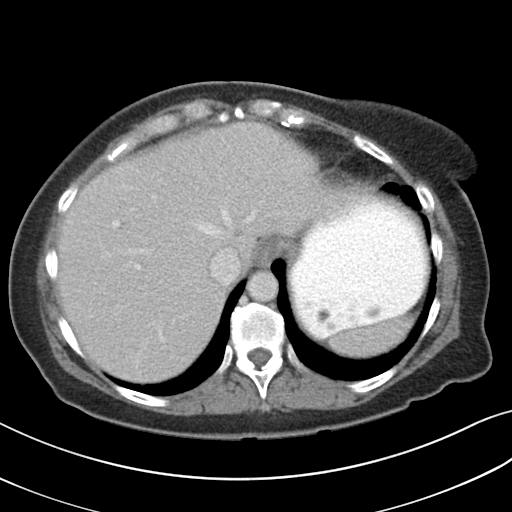
[im 80/90  lung]
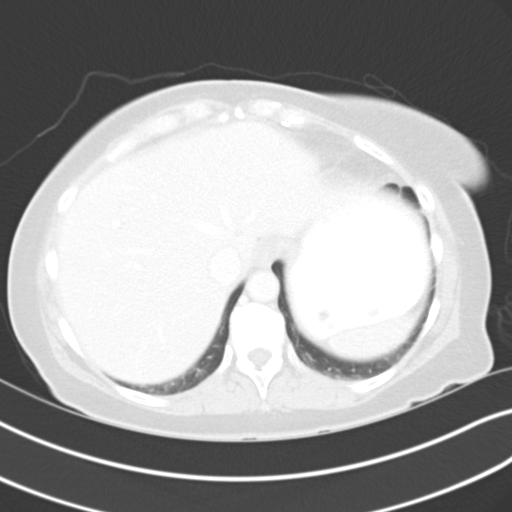
[im 85/90  soft-tissue]
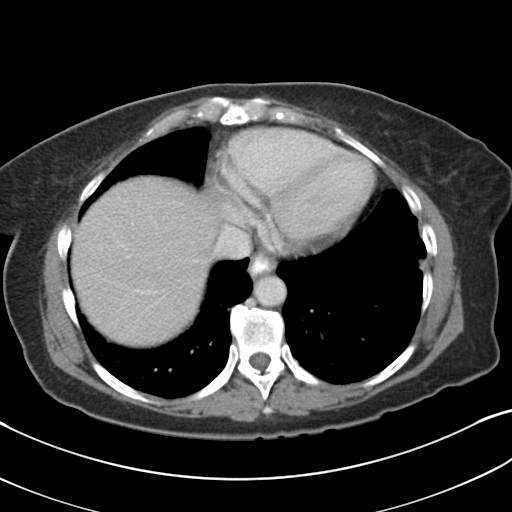
[im 85/90  lung]
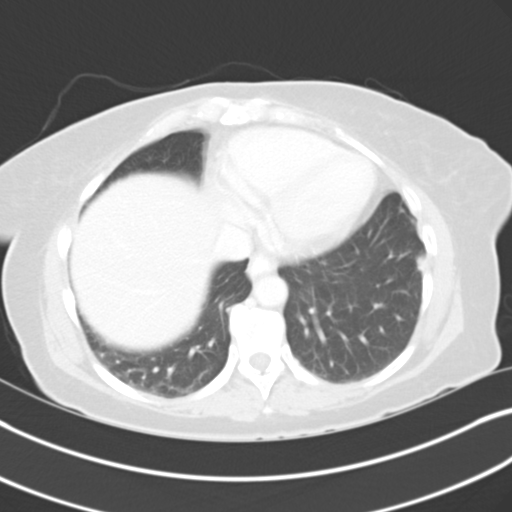

[14 of 32 positions shown; findings below may reference images not displayed]

FINDINGS: Minor bibasilar atelectasis.  Lateral left lower lobe
subpleural nodular scarring evident.  No pericardial or pleural
effusion.  Prior median sternotomy noted.  Normal heart size.

Abdomen:  Prior cholecystectomy evident.  Slight biliary
prominence, suspect post cholecystectomy effect.  Liver, biliary
system, atrophic pancreas, spleen, and adrenal glands are within
normal limits for age and demonstrate no acute process.  Kidneys
demonstrate no renal obstruction, hydronephrosis, or focal
abnormality.  5 mm nonobstructing renal calculus in the left kidney
upper to mid pole, image 25.  No obstructing ureteral calculus on
either side.

Negative for bowel obstruction, dilatation, ileus, or free air.
Gastric distention noted, nonspecific.  The colon is stool-filled.
Appendix is not identified.  No signs of appendicitis.

No abdominal free fluid, fluid collection, hemorrhage, abscess, or
adenopathy.

Aortic atherosclerosis noted without aneurysm or occlusive process.

Pelvis:  Moderate distention of the urinary bladder.  Prior
hysterectomy noted.  No pelvic free fluid, fluid collection,
hemorrhage, abscess, adenopathy, inguinal abnormality, or hernia.
No acute distal bowel process.

Diffuse degenerative changes of the spine and pelvis.
IMPRESSION: Prior cholecystectomy and hysterectomy.

5 mm nonobstructing left intrarenal calculus.

Retained stool throughout the colon compatible with constipation.

No acute intra-abdominal or pelvic finding.

## 2023-02-13 ENCOUNTER — Encounter: Payer: Self-pay | Admitting: Emergency Medicine

## 2023-02-13 ENCOUNTER — Other Ambulatory Visit: Payer: Self-pay

## 2023-02-13 ENCOUNTER — Ambulatory Visit
Admission: EM | Admit: 2023-02-13 | Discharge: 2023-02-13 | Disposition: A | Discharge status: Home / Self Care | Payer: Medicare HMO | Attending: Physician Assistant | Admitting: Physician Assistant

## 2023-02-13 ENCOUNTER — Ambulatory Visit (INDEPENDENT_AMBULATORY_CARE_PROVIDER_SITE_OTHER): Payer: Medicare HMO

## 2023-02-13 DIAGNOSIS — J209 Acute bronchitis, unspecified: Secondary | ICD-10-CM

## 2023-02-13 DIAGNOSIS — R079 Chest pain, unspecified: Secondary | ICD-10-CM

## 2023-02-13 DIAGNOSIS — R051 Acute cough: Secondary | ICD-10-CM

## 2023-02-13 MED ORDER — HYDROCOD POLI-CHLORPHE POLI ER 10-8 MG/5ML PO SUER: 5.0000 mL | Freq: Two times a day (BID) | ORAL | 0 refills | Status: AC | PRN

## 2023-02-13 NOTE — ED Triage Notes (Addendum)
 Patient presents with c/o non productive cough x 10 days. Patient states she saw her PCP 10 days ago was given tessalon  perles and albuterol  inhaler. She also states she tested negative for flu and covid at that time. Patient is also c/o discomfort under her left breast on inspiration.

## 2023-02-13 NOTE — ED Provider Notes (Signed)
 [facility]    CSN: [id number] Arrival date & time: 02/13/23  1400      History   Chief Complaint Chief Complaint  Patient presents with   Cough   Nasal Congestion    HPI Melissa Patel is a 73 y.o. female presenting for 10-day history of dry cough, fatigue, nasal congestion, occasional shortness of breath and intermittent pleuritic left rib pain.  Denies fever but says she has felt hot.  Denies ear pain, sore throat, sinus pain, palpitations.  Patient has taken Tessalon  Perles and used albuterol  as prescribed by primary care provider at onset of symptoms when she was diagnosed with bronchitis.  Reports that her symptoms are just gotten worse and she worries that it could be something more serious.  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  Also has history of diabetes and a pacemaker.  HPI  Past Medical History:  Diagnosis Date   Coronary artery disease    Diabetes mellitus without complication (HCC)     There are no active problems to display for this patient.   Past Surgical History:  Procedure Laterality Date   CARDIAC SURGERY     INCISION AND DRAINAGE  11/20/2011   Procedure: INCISION AND DRAINAGE;  Surgeon: Franky JONELLE Curia, MD;  Location: [facility];  Service: Orthopedics;  Laterality: Left;  INCISION AND DRAINAGE LEFT THUMB     OB History   No obstetric history on file.      Home Medications    Prior to Admission medications   Medication Sig Start Date End Date Taking? Authorizing Provider  amLODipine (NORVASC) 10 MG tablet Take 10 mg by mouth daily.   Yes [provider]  chlorpheniramine-HYDROcodone (TUSSIONEX) 10-8 MG/5ML Take 5 mLs by mouth every 12 (twelve) hours as needed for cough. 02/13/23  Yes Arvis Jolan NOVAK, PA-C  albuterol  (VENTOLIN  HFA) 108 (90 Base) MCG/ACT inhaler every 4 (four) hours if needed for wheezing or shortness of breath    [provider]  aspirin 325 MG tablet Take 325 mg by mouth daily.     [provider]  FLUoxetine  (PROZAC ) 20 MG capsule Take 20 mg by mouth daily.    [provider]  insulin  glargine (LANTUS ) 100 UNIT/ML injection Inject 12-14 Units into the skin 2 (two) times daily. Take 12 units in the morning Take 14 units at night    [provider]  insulin  lispro (HUMALOG) 100 UNIT/ML injection Inject 6 Units into the skin 3 (three) times daily before meals. Per sliding scale    [provider]  pantoprazole  (PROTONIX ) 20 MG tablet Take 20 mg by mouth 2 (two) times daily.    [provider]  rosuvastatin  (CRESTOR ) 20 MG tablet Take 20 mg by mouth daily.    [provider]    Family History No family history on file.  Social History Social History   Tobacco Use   Smoking status: Former  Substance Use Topics   Alcohol use: Yes    Comment: social   Drug use: No     Allergies   Ace inhibitors, Acetaminophen , Angiotensin receptor blockers, Promethazine , Demerol [meperidine], and Lomotil [diphenoxylate]   Review of Systems Review of Systems  Constitutional:  Positive for fatigue. Negative for chills, diaphoresis and fever.  HENT:  Positive for congestion. Negative for ear pain, rhinorrhea, sinus pressure, sinus pain and sore throat.   Respiratory:  Positive for cough  and shortness of breath.   Cardiovascular:  Positive for chest pain. Negative for palpitations.  Gastrointestinal:  Negative for abdominal pain, nausea and vomiting.  Musculoskeletal:  Negative for arthralgias and myalgias.  Skin:  Negative for rash.  Neurological:  Negative for weakness and headaches.  Hematological:  Negative for adenopathy.     Physical Exam Triage Vital Signs ED Triage Vitals  Encounter Vitals Group     BP 02/13/23 1503 (!) 156/78     Systolic BP Percentile --      Diastolic BP Percentile --      Pulse Rate 02/13/23 1503 85     Resp 02/13/23 1503 20      Temp 02/13/23 1503 97.9 F (36.6 C)     Temp Source 02/13/23 1503 Oral     SpO2 02/13/23 1503 100 %     Weight --      Height --      Head Circumference --      Peak Flow --      Pain Score 02/13/23 1507 0     Pain Loc --      Pain Education --      Exclude from Growth Chart --    No data found.  Updated Vital Signs BP (!) 156/78 (BP Location: Left Arm)   Pulse 85   Temp 97.9 F (36.6 C) (Oral)   Resp 20   SpO2 100%     Physical Exam Vitals and nursing note reviewed.  Constitutional:      General: She is not in acute distress.    Appearance: Normal appearance. She is not ill-appearing or toxic-appearing.  HENT:     Head: Normocephalic and atraumatic.     Nose: Congestion present.     Mouth/Throat:     Mouth: Mucous membranes are moist.     Pharynx: Oropharynx is clear.  Eyes:     General: No scleral icterus.       Right eye: No discharge.        Left eye: No discharge.     Conjunctiva/sclera: Conjunctivae normal.  Cardiovascular:     Rate and Rhythm: Normal rate and regular rhythm.     Heart sounds: Normal heart sounds.  Pulmonary:     Effort: Pulmonary effort is normal. No respiratory distress.     Breath sounds: Normal breath sounds.  Musculoskeletal:     Cervical back: Neck supple.  Skin:    General: Skin is dry.  Neurological:     General: No focal deficit present.     Mental Status: She is alert. Mental status is at baseline.     Motor: No weakness.     Gait: Gait normal.  Psychiatric:        Mood and Affect: Mood normal.        Behavior: Behavior normal.      UC Treatments / Results  Labs (all labs ordered are listed, but only abnormal results are displayed) Labs Reviewed - No data to display  EKG   Radiology DG Chest 2 View Result Date: 02/13/2023 CLINICAL DATA:  Cough and congestion EXAM: CHEST - 2 VIEW COMPARISON:  Chest radiograph dated 01/25/2012. FINDINGS: No focal consolidation, pleural effusion, or pneumothorax. The cardiac  silhouette is within normal limits. Median sternotomy wires and left pectoral pacemaker device. No acute osseous pathology. IMPRESSION: No active cardiopulmonary disease. Electronically Signed   By: Vanetta Chou M.D.   On: 02/13/2023 16:28    Procedures ED EKG  Date/Time: 02/13/2023  3:59 PM  Performed by: Arvis Jolan NOVAK, PA-C Authorized by: Arvis Jolan NOVAK, PA-C   Previous ECG:    Previous ECG:  Compared to current   Similarity:  Changes noted   Comparison ECG info:  Prolonged QT when compared to EKG from 2014. Interpretation:    Interpretation: abnormal   Rate:    ECG rate:  77   ECG rate assessment: normal   Rhythm:    Rhythm: sinus rhythm   Ectopy:    Ectopy: none   QRS:    QRS axis:  Normal   QRS intervals:  Normal   QRS conduction: normal   ST segments:    ST segments:  Normal T waves:    T waves: non-specific   Other findings:    Other findings: prolonged qTc interval   Comments:     Normal sinus rhythm with regular rate.  Prolonged QT.  Nonspecific T wave abnormality.  No significant changes other than prolonged QT when compared to EKG from 2014.  No other EKGs available to compare to.  (including critical care time)  Medications Ordered in UC Medications - No data to display  Initial Impression / Assessment and Plan / UC Course  I have reviewed the triage vital signs and the nursing notes.  Pertinent labs & imaging results that were available during my care of the patient were reviewed by me and considered in my medical decision making (see chart for details).   73 year old female presents for 10-day history of cough, congestion, shortness of breath and left-sided chest pain.  Reports symptoms have gotten worse.  Diagnosed with bronchitis at onset of symptoms and given Tessalon  Perles and albuterol  which has not really helped.  No fever.  Patient is afebrile.  Overall well-appearing.  No acute distress.  Slight nasal congestion.  Chest clear.  Heart regular  rate rhythm.  EKG today shows normal sinus rhythm with prolonged QT and nonspecific T wave abnormality.  The only other EKG in the system was from 2014 and there are no significant changes other than prolonged QT.  Chest x-ray obtained.  Chest x-ray without any acute abnormalities.  Reviewed results with patient.  Explained that her symptoms are consistent bronchitis.  She request Tussionex as needed for severe cough at bedtime.  States she has difficulty sleeping and this is the only thing that helped her in the past.  Has tried multiple over-the-counter cough medicines and Tessalon  Perles without relief.  Sent short supply of test next pharmacy after reviewing controlled substance database.  Advised to continue with albuterol , increasing rest and fluids.  Explained that bronchitis can last up to 6 weeks.  Advised that she should go to the ER if she develops fever, has worsening cough, increased chest discomfort, increased breathing difficulty, dizziness, racing heart, weakness, etc.  Otherwise advised to make a follow-up appointment with her primary care provider.   Final Clinical Impressions(s) / UC Diagnoses   Final diagnoses:  Acute cough  Acute bronchitis, unspecified organism  Chest pain, unspecified type     Discharge Instructions      -No significant changes on her EKG. - Chest x-ray shows no abnormalities. - Your symptoms are consistent with bronchitis.  I sent cough medicine to the pharmacy for you.  Increase rest and fluids.  Use inhaler as prescribed by primary care provider. - If you develop fever, worsening cough, increased chest discomfort, dizziness, increased breathing trouble, please go to the ER. - Symptoms related to bronchitis can last for  up to 6 weeks. - Please make a follow-up appointment with your primary care provider in the next couple weeks.     ED Prescriptions     Medication Sig Dispense Auth. Provider   chlorpheniramine-HYDROcodone (TUSSIONEX) 10-8  MG/5ML Take 5 mLs by mouth every 12 (twelve) hours as needed for cough. 70 mL Arvis Jolan NOVAK, PA-C      I have reviewed the PDMP during this encounter.   Arvis Jolan NOVAK, PA-C 02/13/23 [phone]

## 2023-02-13 NOTE — Discharge Instructions (Addendum)
-  No significant changes on her EKG. - Chest x-ray shows no abnormalities. - Your symptoms are consistent with bronchitis.  I sent cough medicine to the pharmacy for you.  Increase rest and fluids.  Use inhaler as prescribed by primary care provider. - If you develop fever, worsening cough, increased chest discomfort, dizziness, increased breathing trouble, please go to the ER. - Symptoms related to bronchitis can last for up to 6 weeks. - Please make a follow-up appointment with your primary care provider in the next couple weeks.

## 2023-06-28 NOTE — ED Provider Notes (Signed)
 [facility]  ED Provider Note  Melissa Patel 73 y.o. female DOB: [birth year] MRN: [id number] History   Chief Complaint  Patient presents with  . Arm Laceration    Left forearm skin tear after a fall about 3 days ago, bleeding was initially controlled has had several episodes of bleeding since, the most recent started about an hour ago, and saturated dressing, oozing bleed ongoing in triage, dressing applied, denies any risk of foreign body    73 year old female presenting to the emergency department with laceration left forearm that she said she sustained Wednesday.  She says her tetanus is up-to-date.  She is presenting today as she has had continued oozing despite multiple attempts to get it stopped.  She says she is on aspirin no other anticoagulants   Arm Laceration      Past Medical History:  Diagnosis Date  . AKI (acute kidney injury) 12/30/2018  . Cancer (*)    skin  . Carotid artery stenosis   . Carpal tunnel syndrome   . Chronic anemia   . CKD (chronic kidney disease)    stage 3  . CKD (chronic kidney disease) stage 4, GFR 15-29 ml/min (*) 08/20/2022  . Coronary artery disease    MI and CABGx2 vessels-1999  . Diabetes mellitus, type 2 (*)    on insulin  pump  . Diabetic gastroparesis (*) 08/20/2022  . Disease of thyroid gland   . GERD (gastroesophageal reflux disease)   . Graves disease   . Heart attack (*) 1999  . Hyperlipidemia   . Hypertension   . Hypotension 08/20/2022  . Hypothyroidism 08/20/2022  . Iron deficiency anemia   . Left nephrolithiasis 08/20/2022  . Major depressive disorder with current active episode 08/09/2020  . Pancreatitis (*)   . Renal insufficiency 03/02/2022  . 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 Unspecified nonpsychotic mental disorder    anxiety    Past Surgical History:  Procedure Laterality Date  . A-v cardiac pacemaker insertion  03/2022   SA node heart block   . Cardiac catheterization    . Cholecystectomy    . Colonoscopy  07/2018  . Coronary artery bypass graft  1999   bypass x 2 vessels  . Cystoscopy w/ ureteroscopy w/ lithotripsy    . Hysterectomy    . Total thyroidectomy    . Upper gastrointestinal endoscopy      Social History   Substance and Sexual Activity  Alcohol Use Yes   Comment: rare   Tobacco Use History[1] E-Cigarettes  . Vaping Use Never User   . Start Date    . Cartridges/Day    . Quit Date     Social History   Substance and Sexual Activity  Drug Use No   Tetanus up to date?: No Immunizations Up to Date?: No   Allergies[2]  Home Medications   ALBUTEROL  SULFATE HFA (PROVENTIL ,VENTOLIN ,PROAIR ) 108 (90 BASE) MCG/ACT INHALER    Inhale two puffs into the lungs every 4 (four) hours as needed.   ALPRAZOLAM (XANAX) 0.25 MG TABLET    Take one tablet (0.25 mg dose) by mouth at bedtime as needed.   AMLODIPINE BESYLATE (NORVASC) 10 MG TABLET    Take one half tablet (5 mg dose) by mouth daily.   ASPIRIN (ECOTRIN LOW DOSE) EC TABLET    Take one tablet (81 mg dose) by mouth daily.   CEFUROXIME (CEFTIN) 250 MG TABLET    Take one tablet (250 mg  dose) by mouth 2 (two) times daily for 7 days.   CYANOCOBALAMIN (VITAMIN B-12) 1000 MCG/ML INJECTION    Inject 1 mL every month by subcutaneous route as directed.   DICYCLOMINE (BENTYL) 20 MG TABLET    TAKE 1 TABLET BY MOUTH EVERY 6 HOURS AS NEEDED FOR ABDOMEN PAIN   ERGOCALCIFEROL 1.25 MG (50000 UT) CAPSULE    Take one capsule (50,000 Units dose) by mouth once a week at 0900.   FAMOTIDINE (PEPCID) 40 MG TABLET    Take one tablet (40 mg dose) by mouth every evening.   FLUOXETINE (PROZAC) 40 MG CAPSULE    Take one capsule (40 mg dose) by mouth daily.   FUROSEMIDE (LASIX) 20 MG TABLET    Take one tablet (20 mg dose) by mouth 2 (two) times daily.   INSULIN  GLARGINE (BASAGLAR  KWIKPEN) 100 UNIT/ML SOPN    Inject up to 30 units SQ QD HS when insulin  pump is not working and/or as instructed  during illness   LEVOTHYROXINE SODIUM (SYNTHROID,LEVOTHROID,LEVOXYL) 100 MCG TABLET    Take 85 mcg by mouth every morning.   LOPERAMIDE  (IMODIUM ) 2 MG CAPSULE    Take one capsule (2 mg dose) by mouth 4 (four) times a day as needed.   NITROGLYCERIN (NITROSTAT) 0.4 MG SL TABLET    nitroglycerin 0.4 mg sublingual tablet  PLACE 1 TABLET UNDER THE TONGUE AS NEEDED AS DIRECTED   NOVOLOG  100 UNIT/ML INJECTION    INSULIN  PUMP   POLYVINYL ALCOHOL-POVIDONE PF (REFRESH) 1.4-0.6 % OPHTHALMIC SOLUTION    Place one drop into both eyes at bedtime. OINTMENT NOT DROPS   PROPYLENE GLYCOL (SYSTANE COMPLETE) 0.6 % SOLN    Apply one drop to eye 4 (four) times daily.   ROSUVASTATIN CALCIUM (CRESTOR) 40 MG TABLET    Take one tablet (40 mg dose) by mouth daily.   TRAMADOL (ULTRAM) 50 MG TABLET    Take one tablet (50 mg dose) by mouth every 6 (six) hours as needed.    Primary Survey  Primary Survey  Review of Systems   Review of Systems  All other systems reviewed and are negative.   Physical Exam   ED Triage Vitals [06/28/23 2220]  BP 144/73  Heart Rate 70  Resp 18  SpO2 96 %  Temp 98.1 F (36.7 C)    Physical Exam  General: Resting comfortably no apparent distress HEENT:[place] AT, EOM's grossly intact, OM pink and moist CV: Regular rate and rhythm, well-perfused throughout Chest: Breathing comfortably on room air Skin: Warm dry, 2.5 cm x 3 cm area of skin tear on mid left dorsal forearm, slight oozing.  No surrounding erythema redness or tenderness. Extremity: No gross deformity, moves all extremities equally Neuro: No gross neurological deficits Psych: Appropriately interactive  ED Course   Lab results: No data to display  Imaging: No data to display    ECG: ECG Results   None                                                                     Pre-Sedation Procedures    Medical Decision Making I used quick clot to maintain hemostasis.   Discussed with her ongoing management, discussed wound care, discussed return precautions and follow-up she is in  agreement with plan.           Provider Communication  New Prescriptions   No medications on file    Modified Medications   No medications on file    Discontinued Medications   No medications on file    Clinical Impression Final diagnoses:  Skin tear of left forearm without complication, initial encounter    ED Disposition     ED Disposition  Discharge   Condition  Stable   Comment  --                 Follow-up Information     Domenic Palagruto, DO In 3 days.   Specialty: Family Medicine Contact information: [address] [place] [address] [phone]                  Electronically signed by:    Alm CHRISTELLA Larence [place]ON[place], FNP 06/28/23 2245      [1] Social History Tobacco Use  Smoking Status Former  . Current packs/day: 0.00  . Average packs/day: 1 pack/day for 29.0 years (29.0 ttl pk-yrs)  . Types: Cigarettes  . Start date: 01/09/1968  . Quit date: 1999  . Years since quitting: 26.4  Smokeless Tobacco Never  Tobacco Comments   Pt smoked 1 pack/week, quit in 1999  [2] Allergies Allergen Reactions  . Compazine Shortness Of Breath    wheezing  . Lomotil Itching    Rash   . Angiotensin Receptor Blockers Other    hypotension  . Atenolol Other    Hypotension  . Atropine Sulfate Other    Wheezing (Combnivent use )  . Combivent Other    Made wheezing worse  . Diphenoxylate Other    Other Reaction: RASH & EDEMA,   . Meperidine Other and Rash    Other Reaction: RASH & EDEMA  . Other Other    Uncoded Allergy. Allergen: beta blocker, Other Reaction: HYPOTENSION  . Phenergan  [Promethazine ] Agitation  . Semaglutide Abdominal Pain    Ozempic  . Tylenol [Acetaminophen] Other    Unable to take due to interaction with current insulin  pump  . Ace Inhibitors Cough  . Altace Cough

## 2023-07-13 ENCOUNTER — Ambulatory Visit
Admission: EM | Admit: 2023-07-13 | Discharge: 2023-07-13 | Disposition: A | Discharge status: Home / Self Care | Attending: Physician Assistant | Admitting: Physician Assistant

## 2023-07-13 ENCOUNTER — Ambulatory Visit

## 2023-07-13 ENCOUNTER — Encounter: Payer: Self-pay | Admitting: Emergency Medicine

## 2023-07-13 DIAGNOSIS — R059 Cough, unspecified: Secondary | ICD-10-CM

## 2023-07-13 DIAGNOSIS — J208 Acute bronchitis due to other specified organisms: Secondary | ICD-10-CM | POA: Diagnosis not present

## 2023-07-13 DIAGNOSIS — B9689 Other specified bacterial agents as the cause of diseases classified elsewhere: Secondary | ICD-10-CM

## 2023-07-13 DIAGNOSIS — E162 Hypoglycemia, unspecified: Secondary | ICD-10-CM

## 2023-07-13 LAB — GLUCOSE, CAPILLARY
Glucose-Capillary: 42 mg/dL — CL (ref 70–99)
Glucose-Capillary: 65 mg/dL — ABNORMAL LOW (ref 70–99)
Glucose-Capillary: 92 mg/dL (ref 70–99)

## 2023-07-13 MED ORDER — GLUCOSE 4 G PO CHEW
1.0000 | CHEWABLE_TABLET | Freq: Once | ORAL | Status: AC
  Administered 2023-07-13: 4 g via ORAL

## 2023-07-13 MED ORDER — BENZONATATE 100 MG PO CAPS: 100.0000 mg | ORAL_CAPSULE | Freq: Three times a day (TID) | ORAL | 0 refills | Status: AC

## 2023-07-13 MED ORDER — ALBUTEROL SULFATE HFA 108 (90 BASE) MCG/ACT IN AERS: 1.0000 | INHALATION_SPRAY | Freq: Four times a day (QID) | RESPIRATORY_TRACT | 0 refills | Status: AC | PRN

## 2023-07-13 MED ORDER — AZITHROMYCIN 250 MG PO TABS: ORAL_TABLET | ORAL | 0 refills | Status: AC

## 2023-07-13 NOTE — Discharge Instructions (Addendum)
 You were seen for cough and low blood sugar and are being treated for bacterial bronchitis.   - Take the antibiotics as prescribed until they're finished. If you think you're having a reaction, stop the medication, take benadryl  and go to the nearest urgent care/emergency room. Take a probiotic while taking the antibiotic to decrease the chances of stomach upset.  - Make sure you have small but frequent meals to keep your blood sugar from dropping again.  Keep your glucose tablets close. - If you continue to feel ill and your blood sugar drops again, follow-up in the emergency department for further care. - I will be in contact about your chest x-ray if anything else needs to be added.  Take care, Dr. Morene, NP-c

## 2023-07-13 NOTE — ED Notes (Signed)
 BG 92 at bedside at 1128 on 07/13/23

## 2023-07-13 NOTE — ED Triage Notes (Signed)
 Patient c/o cough and chest congestion for 3 weeks. Patient had chest x-ray done last Wed.  Patient denies fevers.  Patient states that her BG dropped last night to 40

## 2023-07-13 NOTE — ED Provider Notes (Signed)
 [facility] - [place], [place]   Name: Melissa Patel DOB: 27-Dec-1950 MRN: [id number] CSN: [id number] PCP: Jannett Barbone, MD  Arrival date and time:  07/13/23 1015  Chief Complaint:  Cough and Hyperglycemia   NOTE: Prior to seeing the patient today, I have reviewed the triage nursing documentation and vital signs. Clinical staff has updated patient's PMH/PSHx, current medication list, and drug allergies/intolerances to ensure comprehensive history available to assist in medical decision making.   History:   HPI: Melissa Patel is a 73 y.o. female who presents today with her daughter with complaints of prolonged cough and hypoglycemia.  Patient states she has had a cough and chest congestion for the past week.  She was evaluated by her primary care provider 4 days ago and her chest x-ray showed no abnormalities.  She has been feeling ill for the remainder of the week, noting that her appetite has decreased and energy level has also decreased.  Upon awakening this morning, she noticed her blood sugar was in the 60s.  As the day progressed, it started to drop lower presenting in the 40s at this visit.  Before sleeping last night, it was in the 200s and she gave herself the prescribed dose of 2 units of insulin  via her insulin  pump.   Past Medical History:  Diagnosis Date   Coronary artery disease    Diabetes mellitus without complication [facility])     Past Surgical History:  Procedure Laterality Date   CARDIAC SURGERY     INCISION AND DRAINAGE  11/20/2011   Procedure: INCISION AND DRAINAGE;  Surgeon: Franky JONELLE Curia, MD;  Location: [facility];  Service: Orthopedics;  Laterality: Left;  INCISION AND DRAINAGE LEFT THUMB     History reviewed. No pertinent family history.  Social History   Tobacco Use   Smoking status: Former  Building services engineer status: Never Used  Substance Use Topics   Alcohol use: Yes    Comment: social   Drug use: No    There are no  active problems to display for this patient.   Home Medications:    No outpatient medications have been marked as taking for the 07/13/23 encounter [facility] Encounter).    Allergies:   Ace inhibitors, Acetaminophen, Angiotensin receptor blockers, Promethazine , Demerol [meperidine], and Lomotil [diphenoxylate]  Review of Systems (ROS): Review of Systems   Vital Signs: Today's Vitals   07/13/23 1025 07/13/23 1026  BP:  (!) 104/53  Pulse:  72  Resp:  15  Temp:  98.3 F (36.8 C)  TempSrc:  Oral  SpO2:  99%  Weight: 145 lb 1 oz (65.8 kg)   Height: 5' 2 (1.575 m)   PainSc: 0-No pain     Physical Exam: Physical Exam   Urgent Care Treatments / Results:   LABS: PLEASE NOTE: all labs that were ordered this encounter are listed, however only abnormal results are displayed. Labs Reviewed  GLUCOSE, CAPILLARY - Abnormal; Notable for the following components:      Result Value   Glucose-Capillary 42 (*)    All other components within normal limits  GLUCOSE, CAPILLARY - Abnormal; Notable for the following components:   Glucose-Capillary 65 (*)    All other components within normal limits  CBG MONITORING, ED  CBG MONITORING, ED    EKG: -None  RADIOLOGY: DG Chest 2 View Result Date: 07/13/2023 CLINICAL DATA:  Cough and chest congestion for 3 weeks. EXAM: CHEST - 2 VIEW COMPARISON:  Radiographs 02/13/2023 and 01/25/2012. FINDINGS: The heart size and mediastinal contours are stable post median sternotomy and CABG. Left subclavian pacemaker leads are unchanged, projecting over the right atrium and right ventricle. The lungs appear clear. No pleural effusion or pneumothorax. Mild degenerative changes in the spine without acute osseous abnormality. IMPRESSION: No evidence of acute cardiopulmonary process. Stable postoperative changes. Electronically Signed   By: Elsie Perone M.D.   On: 07/13/2023 11:44    PROCEDURES: Procedures  MEDICATIONS RECEIVED THIS VISIT: Medications   glucose chewable tablet 4 g (4 g Oral Given 07/13/23 1034)    PERTINENT CLINICAL COURSE NOTES/UPDATES:   Initial Impression / Assessment and Plan / Urgent Care Course:  Pertinent labs & imaging results that were available during my care of the patient were personally reviewed by me and considered in my medical decision making (see lab/imaging section of note for values and interpretations).  Melissa Patel is a 73 y.o. female who presents to [facility] today with complaints of cough, diagnosed with bacterial bronchitis and hyperglycemia, and treated as such with the medications below. NP and patient reviewed discharge instructions below during visit.   Patient is well appearing overall in clinic today. She does not appear to be in any acute distress. Presenting symptoms (see HPI) and exam as documented above.   I have reviewed the follow up and strict return precautions for any new or worsening symptoms. Patient is aware of symptoms that would be deemed urgent/emergent, and would thus require further evaluation either here or in the emergency department. At the time of discharge, she verbalized understanding and consent with the discharge plan as it was reviewed with her. All questions were fielded by provider and/or clinic staff prior to patient discharge.    Final Clinical Impressions / Urgent Care Diagnoses:   Final diagnoses:  Cough, unspecified type  Hypoglycemia  Acute bacterial bronchitis    New Prescriptions:  [place] Controlled Substance Registry consulted? Not Applicable  Meds ordered this encounter  Medications   glucose chewable tablet 4 g   azithromycin  (ZITHROMAX ) 250 MG tablet    Sig: Take 2 tablets (500 mg total) by mouth daily for 1 day, THEN 1 tablet (250 mg total) daily for 4 days. Take first 2 tablets together, then 1 every day until finished.    Dispense:  6 tablet    Refill:  0   albuterol  (VENTOLIN  HFA) 108 (90 Base) MCG/ACT inhaler    Sig: Inhale 1-2 puffs  into the lungs every 6 (six) hours as needed for wheezing or shortness of breath.    Dispense:  6.7 g    Refill:  0   benzonatate  (TESSALON ) 100 MG capsule    Sig: Take 1 capsule (100 mg total) by mouth every 8 (eight) hours.    Dispense:  21 capsule    Refill:  0      Discharge Instructions      You were seen for cough and low blood sugar and are being treated for bacterial bronchitis.   - Take the antibiotics as prescribed until they're finished. If you think you're having a reaction, stop the medication, take benadryl  and go to the nearest urgent care/emergency room. Take a probiotic while taking the antibiotic to decrease the chances of stomach upset.  - Make sure you have small but frequent meals to keep your blood sugar from dropping again.  Keep your glucose tablets close. - If you continue to feel ill and your blood sugar drops again,  follow-up in the emergency department for further care. - I will be in contact about your chest x-ray if anything else needs to be added.  Take care, Dr. Morene, NP-c      Recommended Follow up Care:  Patient encouraged to follow up with the following provider within the specified time frame, or sooner as dictated by the severity of her symptoms. As always, she was instructed that for any urgent/emergent care needs, she should seek care either here or in the emergency department for more immediate evaluation.   Marylu Morene, DNP, NP-c   Morene Marylu, NP 07/13/23 [phone]

## 2023-07-13 NOTE — ED Notes (Signed)
 Patient currently eating honey peanut butter sandwich from home

## 2023-07-16 ENCOUNTER — Ambulatory Visit (HOSPITAL_COMMUNITY): Payer: Self-pay

## 2023-09-13 ENCOUNTER — Other Ambulatory Visit: Payer: Self-pay | Admitting: Ophthalmology

## 2023-09-13 DIAGNOSIS — H5789 Other specified disorders of eye and adnexa: Secondary | ICD-10-CM

## 2023-09-20 NOTE — Progress Notes (Signed)
 Consultation   Reason for referral: No chief complaint on file.  Date of Service: 09/20/2023 Date of Birth: 15-Apr-1950 PCP: Trinidad Cassondra Fell, MD   History of Present Illness: Melissa Patel is a 73 y.o.female patient who has Coronary artery disease involving native coronary artery of native heart; Dyslipidemia; Essential hypertension; Type 1 diabetes mellitus with other specified complication (CMS/HHS-HCC); Nephrolithiasis; Palpitations; Diastolic dysfunction; Left-sided carotid artery disease / Asymptomatic L. 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 eyes; s/p BMR recess 6.0 on adj; BIR recess 8.0 on adj 01/25/16 [facility]); Orthostatic hypotension; Graves disease; Insulin  pump fitting or adjustment; DOE (dyspnea on exertion); Abnormal CT scan, small bowel; Bipolar disorder (CMS/HHS-HCC); Body mass index (bmi) 27.0-27.9, adult; Chronic gastritis; Chronic kidney disease, unspecified; Constipation, unspecified; Diabetic gastroparesis  (CMS/HHS-HCC); Diabetic hypoglycemia (CMS/HHS-HCC); Diabetic ketoacidosis without coma associated with type 1 diabetes mellitus (CMS/HHS-HCC); Disorder of arteries and arterioles (); Elevated LFTs; Esophageal dysphagia; Hyponatremia; Iron deficiency anemia; Urinary frequency; Ureteral stone; Unspecified abnormal findings in urine; Tibia/fibula fracture, shaft, left, closed, initial encounter; Other specified nontoxic goiter; Presence of insulin  pump (external) (internal); Hypothyroidism associated with surgical procedure; Polyp of transverse colon, unspecified type; Peripheral venous insufficiency; Nausea and vomiting, intractability of vomiting not specified, unspecified vomiting type; Pancreatic steatorrhea (HHS-HCC); Other and unspecified hyperlipidemia; Normocytic anemia; Nocturia; Major depressive disorder with  current active episode; Long term (current) use of insulin  (CMS/HHS-HCC); Lesion of pancreas (HHS-HCC); and Lipoma of other specified sites on their problem list.   She has a remote history of CAD and CABG with a later diagnosis of high-grade sinoatrial block with pacemaker placement in March, 2024 at [facility].  She initially had some pain from the device site and at times complained that it was rubbing against her collarbone, but now it feels comfortable in the pocket.     Since the pacemaker was placed, she feels much better and her energy has improved.  She recalls that prior to placement, she had severe fatigue and lack of energy.  She is able to exert without much difficulty and presents today along with her husband, who is also a patient.  She has no PND orthopnea and there is no recent history of chest pain or shortness of breath.  She has previously seen Dr. Bradly Molt and says that she currently has a visit to reestablish care.    ECHO 09/2021 [facility]) Left Ventricle: Systolic function is normal. EF: 55-60%.   Left Ventricle: There is mild concentric hypertrophy.   Left Ventricle: Doppler parameters consistent with moderate diastolic dysfunction and elevated LA pressure.   Left Atrium: Left atrium volume index is moderately increased (42-48 mL/m2).   Aortic Valve: The aortic valve is tricuspid. The leaflets are not thickened and exhibit normal excursion.   Mitral Valve: There is moderate regurgitation with a centrally directed jet.   IVC/SVC: The inferior vena cava demonstrates a diameter of <=2.1 cm and collapses >50%; therefore, the right atrial pressure is estimated at .   Tricuspid Valve: The right ventricular systolic pressure is mildly elevated (37-49 mmHg).   Tricuspid Valve: There is moderate regurgitation.   ECHO 12/2021 1. Normal left ventricular cavity size. Normal left ventricular systolic function. LV Ejection Fraction is approximately: 72 %. 2.  Indeterminate diastolic dysfunction based on 7983 ASE guidelines. 3. There are no regional wall motion abnormalities. 4. Normal right ventricular size. Normal right ventricular  systolic function. 5. Mildly dilated left atrium. 6. Moderate mitral valve regurgitation. 7. Estimated pulmonary artery systolic pressure is consistent with mild pulmonary hypertension (35-70mmHg). 8. Small pericardial effusion No echocardiographic evidence to suggest cardiac tamponade.   Holter monitor at [facility] March, 2024: Predominant rhythm is normal sinus rhythm with minimum heart rate of 36 maximum 92 and average heart rate of 58 beats per minute.  There was no evidence of atrial fibrillation  PAC burden equals 0.15%.  PVC burden = 0.06%.  Events were reviewed.  Notable for 4 beat runs of atrial tachycardia for 2 events.  One event was an ectopic atrial rhythm versus junctional  rhythm with heart rates 76-68 beats per minute.  Episode 20.  On March 01, 2022 at 12:01 p.m. p.m. manually added event demonstrates normal sinus rhythm with sinus pause episodes in that rhythm strip.  There has no high-grade AV block seen.   Review of Systems  Constitutional:  Negative for chills, fever, malaise/fatigue and weight loss.  HENT:  Negative for ear pain and hearing loss.   Eyes:  Negative for double vision and pain.  Respiratory:  Negative for cough, shortness of breath and wheezing.   Cardiovascular:  Negative for chest pain, claudication, leg swelling and PND.  Gastrointestinal:  Negative for nausea and vomiting.  Genitourinary:  Negative for dysuria, frequency and urgency.  Musculoskeletal:  Negative for back pain, joint pain and myalgias.  Skin:  Negative for itching and rash.  Endo/Heme/Allergies:  Negative for polydipsia.  Psychiatric/Behavioral:  Negative for hallucinations, memory loss and substance abuse.      Past Medical and Surgical History    Past Medical History:  Diagnosis Date  . Coronary artery  disease    cabg x2 1999  . Dyslipidemia 08/26/2011  . Graves disease    s/p total thyroidectomy 08/2015  . Hyperlipidemia   . Hypertension   . Nephrolithiasis 08/26/2011  . Type 1 diabetes mellitus with other specified complication (CMS/HHS-HCC) 08/26/2011   Past Surgical History She has a past surgical history that includes Cardiac surgery (12/1997); Hysterectomy Total Abdominal W/Removal Tubes &/Or Ovaries (1998); Laparoscopic cholecystectomy (2001); Correction hammer toe (2008); Cardiac catheterization; Thyroidectomy Total (08/18/2015); decompression  optic nerve sheath (Right, 09/09/2015); strabismus surgery 1 horizontal muscle medial/lateral (Bilateral, 01/25/2016); strabismus surgery 1 vertical muscle superior rectus (Bilateral, 01/25/2016); strabismus surgery w/placement adjustable suture (Bilateral, 01/25/2016); and Appendectomy.   Medications and Allergies    Current Outpatient Medications  Medication Sig Dispense Refill  . albuterol  90 mcg/actuation inhaler albuterol  sulfate HFA 90 mcg/actuation aerosol inhaler  INHALE 2 PUFFS INTO LUNGS EVERY 4 HOURS AS NEEDED    . ALPRAZolam (XANAX) 0.5 MG tablet Take 1 tablet (0.5 mg total) by mouth 2 (two) times daily as needed for Sleep or Anxiety for up to 30 days 60 tablet 0  . amLODIPine (NORVASC) 5 MG tablet Take 5 mg by mouth once daily    . artificial tears (GENTEAL) 0.3 % ophthalmic gel daily    . aspirin 81 MG chewable tablet Take 81 mg by mouth once daily.    SABRA atorvastatin (LIPITOR) 40 MG tablet     . blood-glucose meter,continuous (DEXCOM G6 RECEIVER) Misc One Dexcom G6 receiver to use continuously to monitor glucose 1 each 0  . blood-glucose sensor (DEXCOM G6 SENSOR) Devi Place Dexcom G6 sensor every 10 days 3 Device 11  . blood-glucose transmitter (DEXCOM G6 TRANSMITTER) Devi One Dexcom G6 transmitter to use continuously to monitor glucose for 3 months 1 Device 3  .  famotidine (PEPCID) 40 MG tablet Take 40 mg by mouth every evening     . FLUoxetine  (PROZAC ) 20 MG capsule Take 1 capsule (20 mg total) by mouth once daily In addition to 40 mg capsule 30 capsule 11  . FLUoxetine  (PROZAC ) 40 MG capsule 1 capsule    . insulin  ASPART (NOVOLOG  U-100 INSULIN  ASPART) injection (concentration 100 units/mL) INFUSE INSULIN  VIA PUMP AS DIRECTED. MAX DAILY DOSE 80UNITS 20 mL 5  . insulin  syringe-needle U-100 0.5 mL 31 gauge x 5/16 syringe USE QID FOR INSULIN  INJECTION  11  . ketorolac  (ACULAR ) 0.5 % ophthalmic solution ketorolac  0.5 % eye drops    . levothyroxine  (SYNTHROID ) 100 MCG tablet Take 100 mcg by mouth daily    . loperamide  (IMODIUM ) 2 mg capsule Take by mouth    . nitroGLYcerin (NITROSTAT) 0.4 MG SL tablet Place 0.4 mg under the tongue every 5 (five) minutes as needed for Chest pain. May take up to 3 doses.    . ondansetron  (ZOFRAN -ODT) 4 MG disintegrating tablet ondansetron  4 mg disintegrating tablet    . subcutaneous insulin  pump Misc Use     No current facility-administered medications for this visit.   Allergies: Diphenoxylate-atropine, Acetaminophen , Arb-angiotensin receptor antagonist, Ace inhibitors, Atenolol, Atropine sulfate, Combivent [ipratropium-albuterol ], Diphenoxylate, Meperidine, Others, and Promethazine   Social and Family History    Social History   Tobacco Use  . Smoking status: Former    Current packs/day: 0.00    Average packs/day: 0.3 packs/day for 10.0 years (2.5 ttl pk-yrs)    Types: Cigarettes    Start date: 12/09/1987    Quit date: 12/08/1997    Years since quitting: 25.8  . Smokeless tobacco: Never  Vaping Use  . Vaping status: Never Used  Substance Use Topics  . Alcohol use: Never    Alcohol/week: 1.0 standard drink of alcohol    Types: 1 Glasses of wine per week  . Drug use: Never   Family History: family history includes Anemia in her mother; Cancer in her maternal grandmother and paternal grandmother; Cancer (age of onset: 78) in her mother; Cardiomyopathy (Abnormal function of the  heart muscle) in her father; Heart failure (age of onset: 64) in her father; Lung disease in her father.  Physical Examination  BP 120/60 (BP Location: Right upper arm, Patient Position: Sitting, BP Cuff Size: Adult)   Pulse 67   Wt 73.9 kg (163 lb)   SpO2 99%   BMI 31.46 kg/m   Physical Exam Constitutional:      General: She is not in acute distress.    Appearance: She is well-developed. She is not diaphoretic.  HENT:     Head: Normocephalic and atraumatic.     Right Ear: External ear normal.     Left Ear: External ear normal.     Nose: Nose normal.  Eyes:     General: No scleral icterus.       Right eye: No discharge.     Conjunctiva/sclera: Conjunctivae normal.  Cardiovascular:     Rate and Rhythm: Normal rate and regular rhythm.     Heart sounds: Normal heart sounds. No murmur heard.    No friction rub. No gallop.  Pulmonary:     Effort: Pulmonary effort is normal. No respiratory distress.     Breath sounds: Normal breath sounds.  Abdominal:     General: There is no distension.     Palpations: Abdomen is soft.     Tenderness: There is no guarding.  Musculoskeletal:  Cervical back: Normal range of motion and neck supple.  Skin:    Findings: No erythema or rash.  Neurological:     Cranial Nerves: No cranial nerve deficit.     Coordination: Coordination normal.  Psychiatric:        Behavior: Behavior normal.        Thought Content: Thought content normal.        Judgment: Judgment normal.     Assessment and Plan   1. SSS (sick sinus syndrome) (CMS/HHS-HCC)   2. Encounter to establish care   3. Symptomatic bradycardia    She has a dual-chamber pacemaker in place.  The device is functioning normally and there is good battery life.  No changes in her current parameters and at her request, we will continue to follow the pacemaker every 3 months.  Return to EP clinic in 1 year, or sooner if she has any problems.  Requested Prescriptions    No prescriptions  requested or ordered in this encounter   Non-Medicine Orders:No orders of the defined types were placed in this encounter.   Future Appointments     Date/Time Provider Department Center Visit Type   09/20/2023 12:20 PM (Arrive by 12:05 PM) Lavella Evalene Dover, MD [facility] CARD NEW PATIENT   09/24/2023 9:40 AM (Arrive by 9:25 AM) Waylan, Glinda Jes, NP [facility] 1J Nephrology [facility] NEW PATIENT   10/08/2023 9:30 AM Florencio Cara Endow, MD [facility] C NEW PATIENT   10/17/2023 2:00 PM (Arrive by 1:30 PM) Joshua Elsie Batter, MD [facility] 2nd Floor [facility] CARD NEW PATIENT   11/04/2023 8:30 AM (Arrive by 8:15 AM) Annis Larraine Caldron, PA [facility] ENDO INTERNAL REFERRAL ADULT

## 2023-09-28 ENCOUNTER — Encounter: Payer: Self-pay | Admitting: *Deleted

## 2023-09-28 ENCOUNTER — Emergency Department

## 2023-09-28 ENCOUNTER — Observation Stay: Admission: EM | Admit: 2023-09-28 | Discharge: 2023-09-30 | Disposition: A | Discharge status: Home / Self Care

## 2023-09-28 ENCOUNTER — Other Ambulatory Visit: Payer: Self-pay

## 2023-09-28 DIAGNOSIS — G9341 Metabolic encephalopathy: Secondary | ICD-10-CM | POA: Insufficient documentation

## 2023-09-28 DIAGNOSIS — E1022 Type 1 diabetes mellitus with diabetic chronic kidney disease: Secondary | ICD-10-CM | POA: Diagnosis not present

## 2023-09-28 DIAGNOSIS — R197 Diarrhea, unspecified: Secondary | ICD-10-CM | POA: Insufficient documentation

## 2023-09-28 DIAGNOSIS — Z79890 Hormone replacement therapy (postmenopausal): Secondary | ICD-10-CM | POA: Diagnosis not present

## 2023-09-28 DIAGNOSIS — E1065 Type 1 diabetes mellitus with hyperglycemia: Principal | ICD-10-CM | POA: Insufficient documentation

## 2023-09-28 DIAGNOSIS — E785 Hyperlipidemia, unspecified: Secondary | ICD-10-CM | POA: Diagnosis not present

## 2023-09-28 DIAGNOSIS — Z794 Long term (current) use of insulin: Secondary | ICD-10-CM | POA: Diagnosis not present

## 2023-09-28 DIAGNOSIS — I495 Sick sinus syndrome: Secondary | ICD-10-CM | POA: Insufficient documentation

## 2023-09-28 DIAGNOSIS — F5102 Adjustment insomnia: Secondary | ICD-10-CM | POA: Insufficient documentation

## 2023-09-28 DIAGNOSIS — N179 Acute kidney failure, unspecified: Secondary | ICD-10-CM | POA: Diagnosis not present

## 2023-09-28 DIAGNOSIS — F419 Anxiety disorder, unspecified: Secondary | ICD-10-CM | POA: Diagnosis not present

## 2023-09-28 DIAGNOSIS — I129 Hypertensive chronic kidney disease with stage 1 through stage 4 chronic kidney disease, or unspecified chronic kidney disease: Secondary | ICD-10-CM | POA: Insufficient documentation

## 2023-09-28 DIAGNOSIS — E875 Hyperkalemia: Secondary | ICD-10-CM | POA: Insufficient documentation

## 2023-09-28 DIAGNOSIS — E109 Type 1 diabetes mellitus without complications: Secondary | ICD-10-CM

## 2023-09-28 DIAGNOSIS — Z950 Presence of cardiac pacemaker: Secondary | ICD-10-CM | POA: Diagnosis not present

## 2023-09-28 DIAGNOSIS — I10 Essential (primary) hypertension: Secondary | ICD-10-CM

## 2023-09-28 DIAGNOSIS — K5730 Diverticulosis of large intestine without perforation or abscess without bleeding: Secondary | ICD-10-CM | POA: Diagnosis not present

## 2023-09-28 DIAGNOSIS — E890 Postprocedural hypothyroidism: Secondary | ICD-10-CM | POA: Insufficient documentation

## 2023-09-28 DIAGNOSIS — R739 Hyperglycemia, unspecified: Secondary | ICD-10-CM | POA: Diagnosis present

## 2023-09-28 DIAGNOSIS — R7309 Other abnormal glucose: Principal | ICD-10-CM | POA: Diagnosis present

## 2023-09-28 DIAGNOSIS — K219 Gastro-esophageal reflux disease without esophagitis: Secondary | ICD-10-CM

## 2023-09-28 DIAGNOSIS — N189 Chronic kidney disease, unspecified: Secondary | ICD-10-CM | POA: Insufficient documentation

## 2023-09-28 DIAGNOSIS — R112 Nausea with vomiting, unspecified: Secondary | ICD-10-CM

## 2023-09-28 DIAGNOSIS — N289 Disorder of kidney and ureter, unspecified: Secondary | ICD-10-CM

## 2023-09-28 DIAGNOSIS — Z9641 Presence of insulin pump (external) (internal): Secondary | ICD-10-CM | POA: Insufficient documentation

## 2023-09-28 DIAGNOSIS — E1069 Type 1 diabetes mellitus with other specified complication: Secondary | ICD-10-CM

## 2023-09-28 DIAGNOSIS — E039 Hypothyroidism, unspecified: Secondary | ICD-10-CM

## 2023-09-28 DIAGNOSIS — G9340 Encephalopathy, unspecified: Secondary | ICD-10-CM

## 2023-09-28 LAB — T4, FREE: Free T4: 1.2 ng/dL — ABNORMAL HIGH (ref 0.61–1.12)

## 2023-09-28 LAB — URINALYSIS, ROUTINE W REFLEX MICROSCOPIC
Bilirubin Urine: NEGATIVE
Glucose, UA: 150 mg/dL — AB
Hgb urine dipstick: NEGATIVE
Ketones, ur: 5 mg/dL — AB
Leukocytes,Ua: NEGATIVE
Nitrite: NEGATIVE
Protein, ur: NEGATIVE mg/dL
Specific Gravity, Urine: 1.013 (ref 1.005–1.030)
pH: 5 (ref 5.0–8.0)

## 2023-09-28 LAB — CBC
HCT: 35.7 % — ABNORMAL LOW (ref 36.0–46.0)
Hemoglobin: 11.6 g/dL — ABNORMAL LOW (ref 12.0–15.0)
MCH: 30.4 pg (ref 26.0–34.0)
MCHC: 32.5 g/dL (ref 30.0–36.0)
MCV: 93.5 fL (ref 80.0–100.0)
Platelets: 258 K/uL (ref 150–400)
RBC: 3.82 MIL/uL — ABNORMAL LOW (ref 3.87–5.11)
RDW: 12.1 % (ref 11.5–15.5)
WBC: 11.8 K/uL — ABNORMAL HIGH (ref 4.0–10.5)
nRBC: 0 % (ref 0.0–0.2)

## 2023-09-28 LAB — CBG MONITORING, ED
Glucose-Capillary: 41 mg/dL — CL (ref 70–99)
Glucose-Capillary: 78 mg/dL (ref 70–99)
Glucose-Capillary: 96 mg/dL (ref 70–99)
Glucose-Capillary: 31 mg/dL — CL (ref 70–99)
Glucose-Capillary: 123 mg/dL — ABNORMAL HIGH (ref 70–99)
Glucose-Capillary: 251 mg/dL — ABNORMAL HIGH (ref 70–99)
Glucose-Capillary: 332 mg/dL — ABNORMAL HIGH (ref 70–99)
Glucose-Capillary: 372 mg/dL — ABNORMAL HIGH (ref 70–99)

## 2023-09-28 LAB — RESP PANEL BY RT-PCR (RSV, FLU A&B, COVID)  RVPGX2
Influenza A by PCR: NEGATIVE
Influenza B by PCR: NEGATIVE
Resp Syncytial Virus by PCR: NEGATIVE
SARS Coronavirus 2 by RT PCR: NEGATIVE

## 2023-09-28 LAB — BASIC METABOLIC PANEL WITH GFR
Anion gap: 17 — ABNORMAL HIGH (ref 5–15)
BUN: 34 mg/dL — ABNORMAL HIGH (ref 8–23)
CO2: 21 mmol/L — ABNORMAL LOW (ref 22–32)
Calcium: 10.1 mg/dL (ref 8.9–10.3)
Chloride: 103 mmol/L (ref 98–111)
Creatinine, Ser: 2.54 mg/dL — ABNORMAL HIGH (ref 0.44–1.00)
GFR, Estimated: 19 mL/min — ABNORMAL LOW (ref >60)
Glucose, Bld: 75 mg/dL (ref 70–99)
Potassium: 3.7 mmol/L (ref 3.5–5.1)
Sodium: 141 mmol/L (ref 135–145)

## 2023-09-28 LAB — HEPATIC FUNCTION PANEL
ALT: 17 U/L (ref 0–44)
AST: 27 U/L (ref 15–41)
Albumin: 4.4 g/dL (ref 3.5–5.0)
Alkaline Phosphatase: 49 U/L (ref 38–126)
Bilirubin, Direct: 0.1 mg/dL (ref 0.0–0.2)
Indirect Bilirubin: 0.7 mg/dL (ref 0.3–0.9)
Total Bilirubin: 0.8 mg/dL (ref 0.0–1.2)
Total Protein: 7.9 g/dL (ref 6.5–8.1)

## 2023-09-28 LAB — MAGNESIUM: Magnesium: 2 mg/dL (ref 1.7–2.4)

## 2023-09-28 LAB — BLOOD GAS, VENOUS: Patient temperature: 37

## 2023-09-28 LAB — HEMOGLOBIN A1C
Hgb A1c MFr Bld: 6.9 % — ABNORMAL HIGH (ref 4.8–5.6)
Mean Plasma Glucose: 151.33 mg/dL

## 2023-09-28 LAB — TSH: TSH: 2.05 u[IU]/mL (ref 0.350–4.500)

## 2023-09-28 LAB — BETA-HYDROXYBUTYRIC ACID: Beta-Hydroxybutyric Acid: 0.12 mmol/L (ref 0.05–0.27)

## 2023-09-28 LAB — PHOSPHORUS: Phosphorus: 2.6 mg/dL (ref 2.5–4.6)

## 2023-09-28 MED ORDER — ROSUVASTATIN CALCIUM 10 MG PO TABS
20.0000 mg | ORAL_TABLET | Freq: Every day | ORAL | Status: DC
  Administered 2023-09-28 – 2023-09-29 (×2): 20 mg via ORAL
  Filled 2023-09-28: qty 2
  Filled 2023-09-28: qty 1

## 2023-09-28 MED ORDER — ACETAMINOPHEN 650 MG RE SUPP: 650.0000 mg | Freq: Four times a day (QID) | RECTAL | Status: DC | PRN

## 2023-09-28 MED ORDER — MELATONIN 5 MG PO TABS
5.0000 mg | ORAL_TABLET | Freq: Every evening | ORAL | Status: DC | PRN
  Administered 2023-09-29: 5 mg via ORAL
  Filled 2023-09-28: qty 1

## 2023-09-28 MED ORDER — ONDANSETRON HCL 4 MG/2ML IJ SOLN
4.0000 mg | Freq: Four times a day (QID) | INTRAMUSCULAR | Status: DC | PRN
  Administered 2023-09-28 – 2023-09-29 (×3): 4 mg via INTRAVENOUS
  Filled 2023-09-28 (×3): qty 2

## 2023-09-28 MED ORDER — MIDAZOLAM HCL 2 MG/2ML IJ SOLN
1.0000 mg | Freq: Two times a day (BID) | INTRAMUSCULAR | Status: AC | PRN
Start: 2023-09-28 — End: 2023-09-28

## 2023-09-28 MED ORDER — HYDRALAZINE HCL 20 MG/ML IJ SOLN: 5.0000 mg | Freq: Four times a day (QID) | INTRAMUSCULAR | Status: DC | PRN

## 2023-09-28 MED ORDER — INSULIN ASPART 100 UNIT/ML IJ SOLN
0.0000 [IU] | Freq: Every day | INTRAMUSCULAR | Status: DC
  Administered 2023-09-28: 5 [IU] via SUBCUTANEOUS
  Filled 2023-09-28: qty 5

## 2023-09-28 MED ORDER — LEVOTHYROXINE SODIUM 100 MCG PO TABS
100.0000 ug | ORAL_TABLET | Freq: Every day | ORAL | Status: DC
  Administered 2023-09-29 – 2023-09-30 (×2): 100 ug via ORAL
  Filled 2023-09-28 (×2): qty 1

## 2023-09-28 MED ORDER — OXYCODONE HCL 5 MG PO TABS
5.0000 mg | ORAL_TABLET | Freq: Four times a day (QID) | ORAL | Status: AC | PRN
  Administered 2023-09-28 (×2): 5 mg via ORAL
  Filled 2023-09-28 (×2): qty 1

## 2023-09-28 MED ORDER — PANTOPRAZOLE SODIUM 20 MG PO TBEC: 20.0000 mg | DELAYED_RELEASE_TABLET | Freq: Two times a day (BID) | ORAL | Status: DC

## 2023-09-28 MED ORDER — BENZONATATE 100 MG PO CAPS: 100.0000 mg | ORAL_CAPSULE | Freq: Three times a day (TID) | ORAL | Status: DC | PRN

## 2023-09-28 MED ORDER — ACETAMINOPHEN 325 MG PO TABS
650.0000 mg | ORAL_TABLET | Freq: Four times a day (QID) | ORAL | Status: DC | PRN
  Filled 2023-09-28: qty 2

## 2023-09-28 MED ORDER — DEXTROSE 50 % IV SOLN
12.5000 g | Freq: Once | INTRAVENOUS | Status: AC
  Administered 2023-09-28: 12.5 g via INTRAVENOUS
  Filled 2023-09-28: qty 50

## 2023-09-28 MED ORDER — KCL IN DEXTROSE-NACL 20-5-0.9 MEQ/L-%-% IV SOLN
INTRAVENOUS | Status: DC
Start: 2023-09-28 — End: 2023-09-28
  Filled 2023-09-28: qty 1000

## 2023-09-28 MED ORDER — ONDANSETRON HCL 4 MG/2ML IJ SOLN
4.0000 mg | Freq: Once | INTRAMUSCULAR | Status: AC
  Administered 2023-09-28: 4 mg via INTRAVENOUS
  Filled 2023-09-28: qty 2

## 2023-09-28 MED ORDER — HEPARIN SODIUM (PORCINE) 5000 UNIT/ML IJ SOLN
5000.0000 [IU] | Freq: Three times a day (TID) | INTRAMUSCULAR | Status: DC
  Administered 2023-09-28 – 2023-09-30 (×5): 5000 [IU] via SUBCUTANEOUS
  Filled 2023-09-28 (×5): qty 1

## 2023-09-28 MED ORDER — LORAZEPAM 0.5 MG PO TABS
0.5000 mg | ORAL_TABLET | Freq: Two times a day (BID) | ORAL | Status: AC | PRN
  Administered 2023-09-28 (×2): 0.5 mg via ORAL
  Filled 2023-09-28 (×2): qty 1

## 2023-09-28 MED ORDER — INSULIN ASPART 100 UNIT/ML IJ SOLN
3.0000 [IU] | Freq: Once | INTRAMUSCULAR | Status: AC
  Administered 2023-09-28: 3 [IU] via SUBCUTANEOUS
  Filled 2023-09-28: qty 1

## 2023-09-28 MED ORDER — OXYCODONE HCL 5 MG PO TABS
5.0000 mg | ORAL_TABLET | Freq: Once | ORAL | Status: AC
  Administered 2023-09-28: 5 mg via ORAL
  Filled 2023-09-28: qty 1

## 2023-09-28 MED ORDER — LORAZEPAM 0.5 MG PO TABS: 0.5000 mg | ORAL_TABLET | Freq: Three times a day (TID) | ORAL | Status: DC | PRN

## 2023-09-28 MED ORDER — ALBUTEROL SULFATE (2.5 MG/3ML) 0.083% IN NEBU
2.5000 mg | INHALATION_SOLUTION | Freq: Four times a day (QID) | RESPIRATORY_TRACT | Status: DC | PRN
Start: 2023-09-28 — End: 2023-09-30

## 2023-09-28 MED ORDER — INSULIN ASPART 100 UNIT/ML IJ SOLN
0.0000 [IU] | Freq: Three times a day (TID) | INTRAMUSCULAR | Status: DC
  Administered 2023-09-29: 9 [IU] via SUBCUTANEOUS
  Filled 2023-09-28: qty 1

## 2023-09-28 MED ORDER — SODIUM CHLORIDE 0.9 % IV BOLUS
1000.0000 mL | Freq: Once | INTRAVENOUS | Status: AC
  Administered 2023-09-28: 1000 mL via INTRAVENOUS

## 2023-09-28 MED ORDER — METOCLOPRAMIDE HCL 5 MG/ML IJ SOLN
5.0000 mg | Freq: Four times a day (QID) | INTRAMUSCULAR | Status: DC | PRN
  Administered 2023-09-29: 5 mg via INTRAVENOUS
  Filled 2023-09-28: qty 2

## 2023-09-28 MED ORDER — ONDANSETRON HCL 4 MG PO TABS: 4.0000 mg | ORAL_TABLET | Freq: Four times a day (QID) | ORAL | Status: DC | PRN

## 2023-09-28 NOTE — Assessment & Plan Note (Addendum)
 Query gastroenteritis Ondansetron  4 mg IV every 6 hours as needed for nausea and vomiting; Reglan  5 mg IV every 6 hours as needed for refractory nausea and vomiting, 2 doses ordered Status post sodium chloride  1 L bolus per EDP Continue with D5 in normal saline with KCl infusion

## 2023-09-28 NOTE — ED Notes (Signed)
 Pt given sandwich tray and OJ per Dr Nicholaus.

## 2023-09-28 NOTE — ED Notes (Signed)
 Pt stating she feels confused, is able to answer all orientation questions correctly.

## 2023-09-28 NOTE — ED Provider Notes (Signed)
 [facility] Provider Note    Event Date/Time   First MD Initiated Contact with Patient 09/28/23 1153     (approximate)   History   Hyperglycemia   HPI  Melissa Patel is a 73 y.o. female with type 1 diabetes with insulin  pump, CKD, hypertension hyperlipidemia, Graves' disease who presents to the emergency department today with concern that she may be DKA with over 5 days of elevated glucoses in 500s, positive ketones on home urine tests and a second reading of 1 week of cough and slightly increased confusion.  Patient states that she just does not feel like herself and has difficulty doing simple tasks.  She states that given her elevated glucose she gave herself 20 units of insulin  just prior to arrival.  She feels nauseous and had an episode of emesis in the lobby.  She has not been having any alarms on her insulin  pump but wonders whether it has been working appropriately.  Denies any fevers or chills, SI HI      Physical Exam   Triage Vital Signs: ED Triage Vitals  Encounter Vitals Group     BP 09/28/23 1138 111/77     Girls Systolic BP Percentile --      Girls Diastolic BP Percentile --      Boys Systolic BP Percentile --      Boys Diastolic BP Percentile --      Pulse Rate 09/28/23 1138 81     Resp 09/28/23 1138 16     Temp 09/28/23 1138 98.4 F (36.9 C)     Temp Source 09/28/23 1138 Oral     SpO2 09/28/23 1138 99 %     Weight --      Height --      Head Circumference --      Peak Flow --      Pain Score 09/28/23 1139 7     Pain Loc --      Pain Education --      Exclude from Growth Chart --     Most recent vital signs: Vitals:   09/28/23 1400 09/28/23 1430  BP: (!) 106/59 (!) 127/57  Pulse: 78 72  Resp:    Temp:    SpO2: 100% 100%    Nursing Triage Note reviewed. Vital signs reviewed and patients oxygen saturation is normoxic  General: Patient is well nourished, well developed, awake and alert, resting comfortably in no acute  distress Head: Normocephalic and atraumatic Eyes: Normal inspection, extraocular muscles intact, no conjunctival pallor Ear, nose, throat: Normal external exam Neck: Normal range of motion Respiratory: Patient is in no respiratory distress, lungs CTAB Cardiovascular: Patient is not tachycardic, RR GI: Abd SNT with no guarding or rebound  Insulin  pump and glucometer in place Back: Normal inspection of the back with good strength and range of motion throughout all ext Extremities: pulses intact with good cap refills, no LE pitting edema or calf tenderness Neuro: The patient is alert and oriented to person, place, not to time, easily confused with 5/5 bilat UE/LE strength, no gross motor or sensory defects noted. Coordination appears to be adequate. Skin: Warm, dry, and intact Psych: normal mood and affect, no SI or HI  ED Results / Procedures / Treatments   Labs (all labs ordered are listed, but only abnormal results are displayed) Labs Reviewed  BASIC METABOLIC PANEL WITH GFR - Abnormal; Notable for the following components:      Result Value   CO2  21 (*)    BUN 34 (*)    Creatinine, Ser 2.54 (*)    GFR, Estimated 19 (*)    Anion gap 17 (*)    All other components within normal limits  CBC - Abnormal; Notable for the following components:   WBC 11.8 (*)    RBC 3.82 (*)    Hemoglobin 11.6 (*)    HCT 35.7 (*)    All other components within normal limits  URINALYSIS, ROUTINE W REFLEX MICROSCOPIC - Abnormal; Notable for the following components:   Color, Urine YELLOW (*)    APPearance HAZY (*)    Glucose, UA 150 (*)    Ketones, ur 5 (*)    All other components within normal limits  BLOOD GAS, VENOUS - Abnormal; Notable for the following components:   Acid-Base Excess 2.5 (*)    All other components within normal limits  CBG MONITORING, ED - Abnormal; Notable for the following components:   Glucose-Capillary 41 (*)    All other components within normal limits  CBG MONITORING,  ED - Abnormal; Notable for the following components:   Glucose-Capillary 31 (*)    All other components within normal limits  RESP PANEL BY RT-PCR (RSV, FLU A&B, COVID)  RVPGX2  BETA-HYDROXYBUTYRIC ACID  MAGNESIUM   HEPATIC FUNCTION PANEL  TSH  T4, FREE  HEMOGLOBIN A1C  PHOSPHORUS  CBG MONITORING, ED  CBG MONITORING, ED  CBG MONITORING, ED     EKG EKG and rhythm strip are interpreted by myself:   EKG: [Normal sinus rhythm] at heart rate of 71, normal QRS duration, QTc 554, nonspecific ST segments and T waves no ectopy EKG not consistent with Acute STEMI Rhythm strip: NSR in lead II   RADIOLOGY Xray chest CT head without CT renal stone    PROCEDURES:  Critical Care performed: Yes, see critical care procedure note(s)  .Critical Care  Performed by: Nicholaus Rolland BRAVO, MD Authorized by: Nicholaus Rolland BRAVO, MD   Critical care provider statement:    Critical care time (minutes):  32   Critical care was necessary to treat or prevent imminent or life-threatening deterioration of the following conditions:  Endocrine crisis   Critical care was time spent personally by me on the following activities:  Development of treatment plan with patient or surrogate, discussions with consultants, evaluation of patient's response to treatment, examination of patient, ordering and review of laboratory studies, ordering and review of radiographic studies, ordering and performing treatments and interventions, pulse oximetry, re-evaluation of patient's condition and review of old charts   Care discussed with: admitting provider   Comments:     Need for recurrent glucose checks and administration of dextrose  in the setting of hypoglycemia    MEDICATIONS ORDERED IN ED: Medications  dextrose  5 % and 0.9 % NaCl with KCl 20 mEq/L infusion ( Intravenous New Bag/Given 09/28/23 1503)  rosuvastatin  (CRESTOR ) tablet 20 mg (has no administration in time range)  pantoprazole  (PROTONIX ) EC tablet 20 mg (has  no administration in time range)  acetaminophen  (TYLENOL ) tablet 650 mg (has no administration in time range)    Or  acetaminophen  (TYLENOL ) suppository 650 mg (has no administration in time range)  ondansetron  (ZOFRAN ) tablet 4 mg (has no administration in time range)    Or  ondansetron  (ZOFRAN ) injection 4 mg (has no administration in time range)  heparin  injection 5,000 Units (has no administration in time range)  insulin  aspart (novoLOG ) injection 0-9 Units (has no administration in time range)  insulin  aspart (  novoLOG ) injection 0-5 Units (has no administration in time range)  hydrALAZINE  (APRESOLINE ) injection 5 mg (has no administration in time range)  sodium chloride  0.9 % bolus 1,000 mL (0 mLs Intravenous Paused 09/28/23 1226)  dextrose  50 % solution 12.5 g (12.5 g Intravenous Given 09/28/23 1217)  ondansetron  (ZOFRAN ) injection 4 mg (4 mg Intravenous Given 09/28/23 1232)  oxyCODONE  (Oxy IR/ROXICODONE ) immediate release tablet 5 mg (5 mg Oral Given 09/28/23 1341)     IMPRESSION / MDM / ASSESSMENT AND PLAN / ED COURSE                                Differential diagnosis includes, but is not limited to, DKA, hypoglycemia, electrolyte derangement anemia UTI, ICH, pneumonia, acute renal insufficiency   ED course: Patient presents with concern for DKA but is confused on my assessment and history is very difficult to obtain.  Point-of-care glucose here was 41 and she was given a half amp of D50 with appropriate response.  She does have an anion gap but very little ketones and no elevated beta hydroxybutyrate.  I do not think at this time her presentations consistent with DKA, but a VBG is pending.  She does have a significant acute renal insufficiency with a creatinine of being 2.54 when baseline is less than 0.6.  Urinalysis does not seem consistent with UTI we will get a CT renal protocol.  Anticipate bringing the patient in today given her acute renal insufficiency labile glucoses and  encephalopathy   Clinical Course as of 09/28/23 1515  Sat Sep 28, 2023  1201 I went and interviewed patient.  Disconnected insulin  pump at this time.  IV fluids ordered [HD]  1223 Ketones, ur(!): 5 Barely any ketones [HD]  1224 Glucose 41. Half amp ordered. Will pause IVF until I have more information as presentation not quite consistent with DKA at the moment [HD]  1239 Anion gap(!): 17 Elevated [HD]  1240 Glucose-Capillary: 96 Improved [HD]  1240 Beta-Hydroxybutyric Acid: 0.12 Not elevated [HD]  1241 Creatinine(!): 2.54 Much higher than baseline [HD]  1315 Patient reassessed and appears slightly less confused.  She states that her glucose has been incredibly high for the past several days which makes me wonder whether her insulin  pump is working.  At this point she does not have an elevated beta hydroxybutyrate and only minimal ketones.  VBG is pending.  She does state that she has a history of a stone and her creatinine is 2.54.  I do not see evidence of infection but she definitely has an AKI.  She is pending CT head CT renal stone but anticipate she will require admission to the [HD]  1324 Magnesium : 2.0 Despite prolonged QTc not low [HD]  1331 Blood gas, venous(!) Does not seem consistent with DKA [HD]  1418 Glucose-Capillary(!!): 31 Patient dropped again.  Will feed the patient started on D5 and call patient in for admission [HD]  1436 Patient awake and alert and mentating after taking some food [HD]  1438 Case discussed with hospitalist for admission [HD]    Clinical Course User Index [HD] Nicholaus Rolland BRAVO, MD   -- Risk: 5 This patient has a high risk of morbidity due to further diagnostic testing or treatment. Rationale: This patient's evaluation and management involve a high risk of morbidity due to the potential severity of presenting symptoms, need for diagnostic testing, and/or initiation of treatment that may require close monitoring. The  differential includes conditions  with potential for significant deterioration or requiring escalation of care. Treatment decisions in the ED, including medication administration, procedural interventions, or disposition planning, reflect this level of risk. COPA: 5 The patient has the following acute or chronic illness/injury that poses a possible threat to life or bodily function: [X] : The patient has a potentially serious acute condition or an acute exacerbation of a chronic illness requiring urgent evaluation and management in the Emergency Department. The clinical presentation necessitates immediate consideration of life-threatening or function-threatening diagnoses, even if they are ultimately ruled out.   FINAL CLINICAL IMPRESSION(S) / ED DIAGNOSES   Final diagnoses:  Labile blood glucose  Acute encephalopathy  Acute renal insufficiency     Rx / DC Orders   ED Discharge Orders     None        Note:  This document was prepared using Dragon voice recognition software and may include unintentional dictation errors.   Nicholaus Rolland BRAVO, MD 09/28/23 [phone]

## 2023-09-28 NOTE — Assessment & Plan Note (Signed)
 Currently controlled at this time Hydralazine  5 mg IV every 6 hours as needed for SBP greater 175, 5 days

## 2023-09-28 NOTE — ED Notes (Signed)
 Pt complaining of L flank pain, stating she has a known kidney stone that has not moved in years. Dr Nicholaus made aware of pain.

## 2023-09-28 NOTE — H&P (Addendum)
 History and Physical   Melissa Patel FMW:[id number] DOB: Oct 23, 1950 DOA: 09/28/2023  PCP: Palagruto, Domenic, MD Patient coming from: Home   I have personally briefly reviewed patient's old medical records in [facility] EMR.  Chief Concern: pt is concerned about her blood sugar  HPI: [name] is a 73 year old female with history of insulin -dependent diabetes mellitus, hyperlipidemia, depression, anxiety, GERD, hypertension, who presents EDfor chief concerns of she thinks she is in DKA .  Vitals in the ED showed t 98.4, respiration rate 16, heart rate 81, blood pressure 111/77, SpO2 99% on room air.  Serum sodium is 141, potassium 3.7, chloride 103, bicarb 21, BUN of 34, serum creatinine of 2.54, eGFR 19, nonfasting blood glucose 75, WBC 11.8, hemoglobin 11.6, platelets of 258.  ED treatment: Dextrose  12.5 g IV one-time dose, ondansetron  4 mg IV one-time dose, oxycodone  5 mg p.o. one-time dose, D5 and sodium chloride  with KCl at 40 mL/h, sodium chloride  1 L bolus. ------------------------------------ At bedside, patient is able to tell me her first and last name, age, location, current calendar year.  She reports she had 4 episodes of watery diarrhea that was green yesterday.  She reports she vomited so many times a day she cannot count.  She reports all night her blood sugar has been in the 400-500 therefore she has been giving herself insulin .  She denies known sick contacts, trauma to her person, blood in her stool, blood in her urine, syncope, loss of consciousness.  She reports that she has been drinking water as appropriate and denies changes to her diet.  She denies dysuria, hematuria, changes to her urine habits.  She reports that she is compliant with her diet.  She endorses abdominal pain states she would like something for her pain and for anxiety.  She states she takes Xanax at home.  Social history: Lives at home.  She denies tobacco, EtOH, recreational drug use.   She is retired.  ROS: Constitutional: no weight change, no fever ENT/Mouth: no sore throat, no rhinorrhea Eyes: no eye pain, no vision changes Cardiovascular: no chest pain, no dyspnea,  no edema, no palpitations Respiratory: no cough, no sputum, no wheezing Gastrointestinal: + nausea, + vomiting, + diarrhea, no constipation Genitourinary: no urinary incontinence, no dysuria, no hematuria Musculoskeletal: no arthralgias, no myalgias Skin: no skin lesions, no pruritus, Neuro: + weakness, no loss of consciousness, no syncope Psych: no anxiety, no depression, + decrease appetite Heme/Lymph: no bruising, no bleeding  ED Course: Discussed with EDP, patient requiring hospitalization for chief concerns of labile blood sugar.  Assessment/Plan  Principal Problem:   Labile blood glucose Active Problems:   AKI (acute kidney injury) (HCC)   Insulin  dependent type 1 diabetes mellitus (HCC)   Intractable nausea and vomiting   Diarrhea   Sick sinus syndrome (HCC)   Anxiety   Essential hypertension   Hyperlipidemia   Adjustment insomnia   Hypothyroidism   GERD (gastroesophageal reflux disease)   Assessment and Plan:  * Labile blood glucose Patient blood glucose reading low in the 41 and at home was in the 400s I suspect this is secondary to acute kidney injury Continue D5 and sodium chloride  with Kcl Check CBG every 3 hours, 6 times Hold home long-acting insulin  Insulin  SSI with at bedtime coverage, renal dosing ordered on admission Goal inpatient blood glucose levels 140-180 Heart healthy diet only not carb modified on admission.  AM team to change diet as appropriate pending CBG reading  Diarrhea Check GI  panel and C. difficile screening No antidiarrheal medication will be given until GI panel and C. difficile screen are negative for infectious diarrhea  Intractable nausea and vomiting Query gastroenteritis Ondansetron  4 mg IV every 6 hours as needed for nausea and vomiting;  Reglan  5 mg IV every 6 hours as needed for refractory nausea and vomiting, 2 doses ordered Status post sodium chloride  1 L bolus per EDP Continue with D5 in normal saline with KCl infusion  Insulin  dependent type 1 diabetes mellitus (HCC) Home long-acting insulin  not resumed on admission AM team to resume when benefits outweigh the risk Insulin  SSI with at bedtime coverage ordered  AKI (acute kidney injury) (HCC) Secondary to prerenal in setting of GI loss, diarrhea and intractable nausea Etiology workup in progress, UA was negative for nitrates and leukocytes  Anxiety PDMP reviewed Current prescription that was filled on 9 for/04/2023 is alprazolam 0.5 mg tablet, 60 tablets, 30-day supply Alprazolam 0.5 mg p.o. twice daily as needed for anxiety with linked to Melissa Patel 1 mg IV twice daily as needed for anxiety, 2 doses ordered  Sick sinus syndrome (HCC) Status post cardiac pacemaker  GERD (gastroesophageal reflux disease) Resumed home PPI equivalent  Hypothyroidism Secondary to thyroidectomy in setting of Graves' disease with thyroid  storm previously TSH level checked in the ED was within normal limits at 2.050 Home levothyroxine  100 mcg daily before breakfast resumed  Adjustment insomnia Melatonin 5 mg nightly as needed for sleep  Hyperlipidemia Home rosuvastatin  20 mg nightly resumed  Essential hypertension Currently controlled at this time Hydralazine  5 mg IV every 6 hours as needed for SBP greater 175, 5 days  Chart reviewed.   DVT prophylaxis: Heparin  5000 units subcutaneous every 8 hours Code Status: Full code Diet: Heart healthy diet Family Communication: No Disposition Plan: Pending clinical course, pending CBG  Consults called: None at this time Admission status: PCU, observation  Past Medical History:  Diagnosis Date   Coronary artery disease    Diabetes mellitus without complication (HCC)    Essential hypertension 09/28/2023   Past Surgical History:   Procedure Laterality Date   CARDIAC SURGERY     INCISION AND DRAINAGE  11/20/2011   Procedure: INCISION AND DRAINAGE;  Surgeon: Melissa JONELLE Curia, MD;  Location: [facility];  Service: Orthopedics;  Laterality: Left;  INCISION AND DRAINAGE LEFT THUMB    Social History:  reports that she has quit smoking. She does not have any smokeless tobacco history on file. She reports current alcohol use. She reports that she does not use drugs.  Allergies  Allergen Reactions   Ace Inhibitors Cough and Other (See Comments)   Acetaminophen  Other (See Comments)    Interaction per pt with insulin  pump  Unable to take due to interaction with current insulin  pump  Interaction per pt with insulin  pump  Unable to take due to interaction with current insulin  pump   Angiotensin Receptor Blockers Other (See Comments)    hypotension   Promethazine  Other (See Comments)    Makes her have high anxiety   Demerol [Meperidine] Rash   Lomotil [Diphenoxylate] Rash   Family History  Problem Relation Age of Onset   Colon cancer Mother    Hypertension Father    Diabetes Brother    Family history: Family history reviewed and not pertinent.  Prior to Admission medications   Medication Sig Start Date End Date Taking? Authorizing Provider  albuterol  (VENTOLIN  HFA) 108 (90 Base) MCG/ACT inhaler Inhale 1-2 puffs into the lungs every 6 (  six) hours as needed for wheezing or shortness of breath. 07/13/23   Benjamin, Lunise, NP  amLODipine (NORVASC) 10 MG tablet Take 10 mg by mouth daily.    [provider]  aspirin 325 MG tablet Take 325 mg by mouth daily.     [provider]  benzonatate  (TESSALON ) 100 MG capsule Take 1 capsule (100 mg total) by mouth every 8 (eight) hours. 07/13/23   Benjamin, Lunise, NP  chlorpheniramine-HYDROcodone (TUSSIONEX) 10-8 MG/5ML Take 5 mLs by mouth every 12 (twelve) hours as needed for cough. 02/13/23   Arvis Jolan NOVAK, PA-C  FLUoxetine  (PROZAC ) 20 MG capsule Take 20  mg by mouth daily.    [provider]  insulin  glargine (LANTUS ) 100 UNIT/ML injection Inject 12-14 Units into the skin 2 (two) times daily. Take 12 units in the morning Take 14 units at night    [provider]  insulin  lispro (HUMALOG) 100 UNIT/ML injection Inject 6 Units into the skin 3 (three) times daily before meals. Per sliding scale    [provider]  pantoprazole  (PROTONIX ) 20 MG tablet Take 20 mg by mouth 2 (two) times daily.    [provider]  rosuvastatin  (CRESTOR ) 20 MG tablet Take 20 mg by mouth daily.    [provider]   Physical Exam: Vitals:   09/28/23 1300 09/28/23 1330 09/28/23 1400 09/28/23 1430  BP: (!) 147/68 (!) 139/56 (!) 106/59 (!) 127/57  Pulse: 73 82 78 72  Resp: 18 18    Temp:      TempSrc:      SpO2: 99% 100% 100% 100%   Constitutional: appears age-appropriate,  mildly uncomfortable, anxious Eyes: PERRL, lids and conjunctivae normal ENMT: Mucous membranes are moist. Posterior pharynx clear of any exudate or lesions. Age-appropriate dentition. Hearing appropriate Neck: normal, supple, no masses, no thyromegaly Respiratory: clear to auscultation bilaterally, no wheezing, no crackles. Normal respiratory effort. No accessory muscle use.  Cardiovascular: Regular rate and rhythm, no murmurs / rubs / gallops. No extremity edema. 2+ pedal pulses. No carotid bruits.  Abdomen: + Mild generalized tenderness, no masses palpated, no hepatosplenomegaly. Bowel sounds positive.  Musculoskeletal: no clubbing / cyanosis. No joint deformity upper and lower extremities. Good ROM, no contractures, no atrophy. Normal muscle tone.  Skin: no rashes, lesions, ulcers. No induration Neurologic: Sensation intact. Strength 5/5 in all 4.  Psychiatric: Lacks judgment and insight. Alert and oriented x 3.  Anxious mood.   EKG: independently reviewed, showing sinus rhythm with rate of 71, QTc 554  Chest x-ray on Admission: I personally  reviewed and I agree with radiologist reading as below.  CT Renal Stone Study Result Date: 09/28/2023 CLINICAL DATA:  Flank pain.  Nausea and vomiting. EXAM: CT ABDOMEN AND PELVIS WITHOUT CONTRAST TECHNIQUE: Multidetector CT imaging of the abdomen and pelvis was performed following the standard protocol without IV contrast. RADIATION DOSE REDUCTION: This exam was performed according to the departmental dose-optimization program which includes automated exposure control, adjustment of the mA and/or kV according to patient size and/or use of iterative reconstruction technique. COMPARISON:  12/21/2012 FINDINGS: Lower chest: No acute findings. Hepatobiliary: No mass visualized on this unenhanced exam. Prior cholecystectomy. No evidence of biliary obstruction. Pancreas: No mass or inflammatory process visualized on this unenhanced exam. Spleen:  Within normal limits in size. Adrenals/Urinary tract: No evidence of urolithiasis or hydronephrosis. Unremarkable unopacified urinary bladder. Stomach/Bowel: No evidence of obstruction, inflammatory process, or abnormal fluid collections. Mild diverticulosis is seen mainly involving the sigmoid colon, however  there is no evidence of diverticulitis. Vascular/Lymphatic: No pathologically enlarged lymph nodes identified. No evidence of abdominal aortic aneurysm. Reproductive: Prior hysterectomy noted. Adnexal regions are unremarkable in appearance. Other:  None. Musculoskeletal:  No suspicious bone lesions identified. IMPRESSION: No evidence of urolithiasis, hydronephrosis, or other acute findings. Colonic diverticulosis, without radiographic evidence of diverticulitis. Electronically Signed   By: Norleen DELENA Kil M.D.   On: 09/28/2023 13:45   CT Head Wo Contrast Result Date: 09/28/2023 CLINICAL DATA:  Acute encephalopathy. EXAM: CT HEAD WITHOUT CONTRAST TECHNIQUE: Contiguous axial images were obtained from the base of the skull through the vertex without intravenous contrast.  RADIATION DOSE REDUCTION: This exam was performed according to the departmental dose-optimization program which includes automated exposure control, adjustment of the mA and/or kV according to patient size and/or use of iterative reconstruction technique. COMPARISON:  06/19/2012 FINDINGS: Brain: No evidence of intracranial hemorrhage, acute infarction, hydrocephalus, extra-axial collection, or mass lesion/mass effect. Vascular:  No hyperdense vessel or other acute findings. Skull: No evidence of fracture or other significant bone abnormality. Sinuses/Orbits:  No acute findings. Other: None. IMPRESSION: Negative noncontrast head CT. Electronically Signed   By: Norleen DELENA Kil M.D.   On: 09/28/2023 13:39   DG Chest Portable 1 View Result Date: 09/28/2023 CLINICAL DATA:  Shortness of breath. EXAM: PORTABLE CHEST 1 VIEW COMPARISON:  07/13/2023 FINDINGS: Median sternotomy wires and left-sided pacemaker unchanged. Lungs are adequately inflated without focal airspace consolidation or effusion. Cardiomediastinal silhouette and remainder of the exam is unchanged. IMPRESSION: No active disease. Electronically Signed   By: Toribio Agreste M.D.   On: 09/28/2023 13:22   Labs on Admission: I have personally reviewed following labs  CBC: Recent Labs  Lab 09/28/23 1142  WBC 11.8*  HGB 11.6*  HCT 35.7*  MCV 93.5  PLT 258   Basic Metabolic Panel: Recent Labs  Lab 09/28/23 1142  NA 141  K 3.7  CL 103  CO2 21*  GLUCOSE 75  BUN 34*  CREATININE 2.54*  CALCIUM  10.1  MG 2.0   GFR: CrCl cannot be calculated (Unknown ideal weight.).  Liver Function Tests: Recent Labs  Lab 09/28/23 1142  AST 27  ALT 17  ALKPHOS 49  BILITOT 0.8  PROT 7.9  ALBUMIN 4.4   CBG: Recent Labs  Lab 09/28/23 1146 09/28/23 1213 09/28/23 1233 09/28/23 1355  GLUCAP 78 41* 96 31*   Thyroid  Function Tests: Recent Labs    09/28/23 1142  TSH 2.050   Urine analysis:    Component Value Date/Time   COLORURINE YELLOW (A)  09/28/2023 1142   APPEARANCEUR HAZY (A) 09/28/2023 1142   LABSPEC 1.013 09/28/2023 1142   PHURINE 5.0 09/28/2023 1142   GLUCOSEU 150 (A) 09/28/2023 1142   HGBUR NEGATIVE 09/28/2023 1142   BILIRUBINUR NEGATIVE 09/28/2023 1142   KETONESUR 5 (A) 09/28/2023 1142   PROTEINUR NEGATIVE 09/28/2023 1142   UROBILINOGEN 1.0 06/13/2013 0013   NITRITE NEGATIVE 09/28/2023 1142   LEUKOCYTESUR NEGATIVE 09/28/2023 1142   CRITICAL CARE Performed by: Dr. Sherre  Total critical care time: 32 minutes  Critical care time was exclusive of separately billable procedures and treating other patients.  Critical care was necessary to treat or prevent imminent or life-threatening deterioration.  Critical care was time spent personally by me on the following activities: development of treatment plan with patient as well as nursing, discussions with consultants, evaluation of patient's response to treatment, examination of patient, obtaining history from patient or surrogate, ordering and performing treatments and interventions, ordering and review  of laboratory studies, ordering and review of radiographic studies, pulse oximetry and re-evaluation of patient's condition.  This document was prepared using Dragon Voice Recognition software and may include unintentional dictation errors.  Dr. Sherre [facility]  If 7PM-7AM, please contact overnight-coverage provider If 7AM-7PM, please contact day attending provider [email or web address]  09/28/2023, 3:32 PM

## 2023-09-28 NOTE — Assessment & Plan Note (Addendum)
 PDMP reviewed Current prescription that was filled on 9 for/04/2023 is alprazolam 0.5 mg tablet, 60 tablets, 30-day supply Alprazolam 0.5 mg p.o. twice daily as needed for anxiety with linked to Mutaz 1 mg IV twice daily as needed for anxiety, 2 doses ordered

## 2023-09-28 NOTE — ED Notes (Signed)
 CBG 41, DOCTOR AND RN NOTIFIED. RECHECK IN 15 MINS

## 2023-09-28 NOTE — ED Notes (Signed)
Pt vomited in triage

## 2023-09-28 NOTE — Assessment & Plan Note (Addendum)
 Resumed home PPI equivalent

## 2023-09-28 NOTE — Assessment & Plan Note (Signed)
 -  Melatonin 5 mg nightly as needed for sleep

## 2023-09-28 NOTE — Assessment & Plan Note (Signed)
 Status post cardiac pacemaker

## 2023-09-28 NOTE — Assessment & Plan Note (Addendum)
 Patient blood glucose reading low in the 41 and at home was in the 400s I suspect this is secondary to acute kidney injury Continue D5 and sodium chloride  with Kcl Check CBG every 3 hours, 6 times Hold home long-acting insulin  Insulin  SSI with at bedtime coverage, renal dosing ordered on admission Goal inpatient blood glucose levels 140-180 Heart healthy diet only not carb modified on admission.  AM team to change diet as appropriate pending CBG reading

## 2023-09-28 NOTE — ED Notes (Signed)
 Pt still stating she feels confused, does repeat self and questions but is still able to answer orientation questions.

## 2023-09-28 NOTE — Hospital Course (Signed)
 [name] is a 73 year old female with history of insulin -dependent diabetes mellitus, hyperlipidemia, depression, anxiety, GERD, hypertension, who presents EDfor chief concerns of she thinks she is in DKA .  Vitals in the ED showed t 98.4, respiration rate 16, heart rate 81, blood pressure 111/77, SpO2 99% on room air.  Serum sodium is 141, potassium 3.7, chloride 103, bicarb 21, BUN of 34, serum creatinine of 2.54, eGFR 19, nonfasting blood glucose 75, WBC 11.8, hemoglobin 11.6, platelets of 258.  ED treatment: Dextrose  12.5 g IV one-time dose, ondansetron  4 mg IV one-time dose, oxycodone  5 mg p.o. one-time dose, D5 and sodium chloride  with KCl at 40 mL/h, sodium chloride  1 L bolus.

## 2023-09-28 NOTE — Assessment & Plan Note (Signed)
 Check GI panel and C. difficile screening No antidiarrheal medication will be given until GI panel and C. difficile screen are negative for infectious diarrhea

## 2023-09-28 NOTE — Assessment & Plan Note (Addendum)
 Secondary to prerenal in setting of GI loss, diarrhea and intractable nausea Etiology workup in progress, UA was negative for nitrates and leukocytes

## 2023-09-28 NOTE — Assessment & Plan Note (Addendum)
 Home rosuvastatin 20 mg nightly resumed

## 2023-09-28 NOTE — ED Notes (Signed)
 Pt had to go to the bathroom to void before triage was completed

## 2023-09-28 NOTE — ED Notes (Signed)
 This tech answered call light for pt and pt stated my monitor is telling me my blood sugar is over 400. Pt reporting nausea and dizziness. Pt reassured that I will let her nurse know. No further requests at this time.

## 2023-09-28 NOTE — ED Triage Notes (Signed)
 Pt reports that she thinks she is in DKA.  She reports that her CBG was over 500 this am and she has been giving herself insulin  via pump, she reports that she has given herself an additional 20 units of insulin  today.  Pt has been having nausea and vomiting here.  Pt also reports that she has been having URI with cough.

## 2023-09-28 NOTE — Assessment & Plan Note (Addendum)
 Secondary to thyroidectomy in setting of Graves' disease with thyroid  storm previously TSH level checked in the ED was within normal limits at 2.050 Home levothyroxine  100 mcg daily before breakfast resumed

## 2023-09-28 NOTE — Assessment & Plan Note (Signed)
 Home long-acting insulin  not resumed on admission AM team to resume when benefits outweigh the risk Insulin  SSI with at bedtime coverage ordered

## 2023-09-29 DIAGNOSIS — R7309 Other abnormal glucose: Secondary | ICD-10-CM | POA: Diagnosis not present

## 2023-09-29 LAB — BASIC METABOLIC PANEL WITH GFR
Anion gap: 9 (ref 5–15)
Anion gap: 11 (ref 5–15)
BUN: 29 mg/dL — ABNORMAL HIGH (ref 8–23)
BUN: 29 mg/dL — ABNORMAL HIGH (ref 8–23)
CO2: 23 mmol/L (ref 22–32)
CO2: 26 mmol/L (ref 22–32)
Calcium: 8.6 mg/dL — ABNORMAL LOW (ref 8.9–10.3)
Calcium: 9.1 mg/dL (ref 8.9–10.3)
Chloride: 99 mmol/L (ref 98–111)
Chloride: 99 mmol/L (ref 98–111)
Creatinine, Ser: 1.88 mg/dL — ABNORMAL HIGH (ref 0.44–1.00)
Creatinine, Ser: 1.96 mg/dL — ABNORMAL HIGH (ref 0.44–1.00)
GFR, Estimated: 28 mL/min — ABNORMAL LOW (ref >60)
GFR, Estimated: 27 mL/min — ABNORMAL LOW (ref >60)
Glucose, Bld: 470 mg/dL — ABNORMAL HIGH (ref 70–99)
Glucose, Bld: 197 mg/dL — ABNORMAL HIGH (ref 70–99)
Potassium: 5.7 mmol/L — ABNORMAL HIGH (ref 3.5–5.1)
Potassium: 4.6 mmol/L (ref 3.5–5.1)
Sodium: 131 mmol/L — ABNORMAL LOW (ref 135–145)
Sodium: 136 mmol/L (ref 135–145)

## 2023-09-29 LAB — CBC
HCT: 32.6 % — ABNORMAL LOW (ref 36.0–46.0)
Hemoglobin: 10.5 g/dL — ABNORMAL LOW (ref 12.0–15.0)
MCH: 30.5 pg (ref 26.0–34.0)
MCHC: 32.2 g/dL (ref 30.0–36.0)
MCV: 94.8 fL (ref 80.0–100.0)
Platelets: 194 K/uL (ref 150–400)
RBC: 3.44 MIL/uL — ABNORMAL LOW (ref 3.87–5.11)
RDW: 12.1 % (ref 11.5–15.5)
WBC: 6.3 K/uL (ref 4.0–10.5)
nRBC: 0 % (ref 0.0–0.2)

## 2023-09-29 LAB — CBG MONITORING, ED
Glucose-Capillary: 242 mg/dL — ABNORMAL HIGH (ref 70–99)
Glucose-Capillary: 298 mg/dL — ABNORMAL HIGH (ref 70–99)
Glucose-Capillary: 363 mg/dL — ABNORMAL HIGH (ref 70–99)
Glucose-Capillary: 388 mg/dL — ABNORMAL HIGH (ref 70–99)
Glucose-Capillary: 419 mg/dL — ABNORMAL HIGH (ref 70–99)
Glucose-Capillary: 446 mg/dL — ABNORMAL HIGH (ref 70–99)

## 2023-09-29 LAB — GLUCOSE, CAPILLARY
Glucose-Capillary: 173 mg/dL — ABNORMAL HIGH (ref 70–99)
Glucose-Capillary: 96 mg/dL (ref 70–99)

## 2023-09-29 MED ORDER — FLUOXETINE HCL 20 MG PO CAPS
20.0000 mg | ORAL_CAPSULE | Freq: Every day | ORAL | Status: DC
  Administered 2023-09-29 – 2023-09-30 (×2): 20 mg via ORAL
  Filled 2023-09-29 (×2): qty 1

## 2023-09-29 MED ORDER — ONDANSETRON HCL 4 MG/2ML IJ SOLN: 4.0000 mg | Freq: Four times a day (QID) | INTRAMUSCULAR | Status: DC | PRN

## 2023-09-29 MED ORDER — OXYCODONE HCL 5 MG PO TABS: 2.5000 mg | ORAL_TABLET | ORAL | Status: DC | PRN

## 2023-09-29 MED ORDER — LACTATED RINGERS IV SOLN: INTRAVENOUS | Status: DC

## 2023-09-29 MED ORDER — SODIUM ZIRCONIUM CYCLOSILICATE 10 G PO PACK
10.0000 g | PACK | Freq: Once | ORAL | Status: AC
  Administered 2023-09-29: 10 g via ORAL
  Filled 2023-09-29: qty 1

## 2023-09-29 MED ORDER — INSULIN ASPART 100 UNIT/ML IJ SOLN: 0.0000 [IU] | INTRAMUSCULAR | Status: DC

## 2023-09-29 MED ORDER — OXYCODONE HCL 5 MG PO TABS
5.0000 mg | ORAL_TABLET | Freq: Four times a day (QID) | ORAL | Status: DC | PRN
Start: 2023-09-29 — End: 2023-09-29
  Administered 2023-09-29: 5 mg via ORAL
  Filled 2023-09-29: qty 1

## 2023-09-29 MED ORDER — INSULIN PUMP
Freq: Three times a day (TID) | SUBCUTANEOUS | Status: DC
  Filled 2023-09-29: qty 1

## 2023-09-29 NOTE — ED Notes (Signed)
 Pts FSBS noted to be 419 at this time. Pt continues to state concerns about glucose, CT results, and the plan for her stay. Pt made aware that doctors will round on her this morning but unsure of time. Dr. Lawence made aware via secure chat of pts FSBS. 9 units of insulin  to be given at this time instead of 0800 per verbal order.
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
## 2023-09-30 DIAGNOSIS — R7309 Other abnormal glucose: Secondary | ICD-10-CM | POA: Diagnosis not present

## 2023-09-30 LAB — GLUCOSE, CAPILLARY
Glucose-Capillary: 124 mg/dL — ABNORMAL HIGH (ref 70–99)
Glucose-Capillary: 98 mg/dL (ref 70–99)
Glucose-Capillary: 101 mg/dL — ABNORMAL HIGH (ref 70–99)

## 2023-09-30 LAB — BASIC METABOLIC PANEL WITH GFR
Anion gap: 7 (ref 5–15)
BUN: 26 mg/dL — ABNORMAL HIGH (ref 8–23)
CO2: 27 mmol/L (ref 22–32)
Calcium: 8.8 mg/dL — ABNORMAL LOW (ref 8.9–10.3)
Chloride: 101 mmol/L (ref 98–111)
Creatinine, Ser: 1.65 mg/dL — ABNORMAL HIGH (ref 0.44–1.00)
GFR, Estimated: 33 mL/min — ABNORMAL LOW (ref >60)
Glucose, Bld: 125 mg/dL — ABNORMAL HIGH (ref 70–99)
Potassium: 4.1 mmol/L (ref 3.5–5.1)
Sodium: 135 mmol/L (ref 135–145)

## 2023-09-30 LAB — PHOSPHORUS: Phosphorus: 3.9 mg/dL (ref 2.5–4.6)

## 2023-09-30 LAB — MAGNESIUM: Magnesium: 1.9 mg/dL (ref 1.7–2.4)

## 2023-09-30 MED ORDER — INSULIN PUMP: 1.0000 | Freq: Three times a day (TID) | SUBCUTANEOUS | Status: AC

## 2023-09-30 NOTE — Care Management Obs Status (Signed)
 MEDICARE OBSERVATION STATUS NOTIFICATION   Patient Details  Name: Melissa Patel MRN: [id number] Date of Birth: 08-22-1950   Medicare Observation Status Notification Given:  Yes    Rojelio SHAUNNA Rattler 09/30/2023, 12:43 PM

## 2023-09-30 NOTE — TOC Transition Note (Signed)
 Transition of Care [facility]) - Discharge Note   Patient Details  Name: Melissa Patel MRN: [id number] Date of Birth: 1950/11/11  [facility] CM/SW Contact:  Alfonso Rummer, LCSW Phone Number: 09/30/2023, 5:05 PM   Clinical Narrative:    No TOC needs identified. Pt discharged home          Patient Goals and CMS Choice            Discharge Placement                       Discharge Plan and Services Additional resources added to the After Visit Summary for                                       Social Drivers of Health (SDOH) Interventions SDOH Screenings   Food Insecurity: No Food Insecurity (09/29/2023)  Housing: Low Risk  (09/29/2023)  Transportation Needs: No Transportation Needs (09/29/2023)  Utilities: Not At Risk (09/29/2023)  Financial Resource Strain: Low Risk  (08/22/2023)   Received from [facility]  Social Connections: Moderately Isolated (09/29/2023)  Stress: No Stress Concern Present (03/15/2021)   [facility] from [facility]  Tobacco Use: Medium Risk (09/28/2023)     Readmission Risk Interventions     No data to display

## 2023-09-30 NOTE — TOC CM/SW Note (Signed)
 Transition of Care [facility]) - Inpatient Brief Assessment   Patient Details  Name: Melissa Patel MRN: [id number] Date of Birth: 04-02-1950  Transition of Care [facility]) CM/SW Contact:    Alfonso Rummer, LCSW Phone Number: 09/30/2023, 11:24 AM   Clinical Narrative:  KEN DELENA Rummer completed TOC chart review no TIC needs identified. Contact should TOC for further assistance.   Transition of Care Asessment:

## 2023-09-30 NOTE — Discharge Summary (Addendum)
 Physician Discharge Summary   Patient: Melissa Patel MRN: [id number] DOB: 04-18-1950  Admit date:     09/28/2023  Discharge date: 09/30/23  Discharge Physician: Laree Lock   PCP: Palagruto, Domenic, MD   Recommendations at discharge:   Follow-up with PCP within 1 week -repeat BMP, creatinine Endocrine referral provided  Follow-up with nephrology as recommended  Discharge Diagnoses: Principal Problem:   Labile blood glucose Active Problems:   AKI (acute kidney injury) (HCC)   Insulin  dependent type 1 diabetes mellitus (HCC)   Intractable nausea and vomiting   Diarrhea   Sick sinus syndrome (HCC)   Anxiety   Essential hypertension   Hyperlipidemia   Adjustment insomnia   Hypothyroidism   GERD (gastroesophageal reflux disease)   Hospital Course: Melissa Patel is a 73 year old female with history of insulin -dependent diabetes mellitus, hyperlipidemia, depression, anxiety, GERD, hypertension presented due to confusion and labile blood glucose.  Admitted for further management    Hypoglycemia and Acute metabolic encephalopathy - resolved Insulin  dependent type 1 diabetes mellitus - HbA1c 6.9, On Insulin  pump at home - On presentation patient was found to be encephalopathic and hypoglycemic - which resolved, suspect likely due to AKI oN CKD due to suspected gastroenteritis - No evidence of DKA, glucose improved as AKI resolved - Started on insulin  pump, follow-up with PCP outpatient - Endocrinology referral provided - Consistent carb diet   Intractable nausea and vomiting, diarrhea - resolved Suspect gastroenteritis - Leukocytosis resolved - Flu/COVID/RSV negative, CXR neg, UA negative - CT renal study negative - Had mild left-sided CVA tenderness which resolved   Mild hyperkalemia -Lokelma  10g x 1 dose - resolved   AKI on CKD - Cr improving 2.54 -> 1.65, Baseline Cr ~ 1.4-1.5 - Secondary to prerenal in setting of GI loss, diarrhea, emesis - s/p IV fluids,  encourage p.o. 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 and patient was instructed, not to drive, operate heavy machinery, perform activities at heights, swimming or participation in water activities or provide baby-sitting services while on Pain, Sleep and Anxiety Medications; until their outpatient Physician has advised to do so again. Also recommended to not to take more than prescribed Pain, Sleep and Anxiety Medications.  Consultants: None Procedures performed: None Disposition: Home Diet recommendation:  Discharge Diet Orders (From admission, onward)     Start     Ordered   09/30/23 0000  Diet - low sodium heart healthy        09/30/23 1340   09/30/23 0000  Diet Carb Modified        09/30/23 1340            DISCHARGE MEDICATION: Allergies as of 09/30/2023       Reactions   Diphenoxylate-atropine Hives, Other (See Comments), Itching   Rash  Rash   Ipratropium-albuterol  Other (See Comments), Shortness Of Breath   Wheezing Other reaction(s): Other (See Comments)  Wheezing  Made wheezing worse    Made wheezing worse Made wheezing worse  Lisinopril Anaphylaxis   Ace Inhibitors Cough, Other (See Comments)   Acetaminophen  Other (See Comments)   Interaction per pt with insulin  pump Unable to take due to interaction with current insulin  pump  Interaction per pt with insulin  pump Unable to take due to interaction with current insulin  pump    Angiotensin Receptor Blockers Other (See Comments)   hypotension   Nsaids Other (See Comments)   HX. PUD   Other Other (See Comments)   Uncoded Allergy. Allergen: beta blocker, Other Reaction: HYPOTENSION   Ramipril Cough   Semaglutide Other (See Comments)   Ozempic   Promethazine  Other (See Comments)   Makes her have high anxiety   Demerol [meperidine] Rash   Lomotil [diphenoxylate] Rash        Medication List     PAUSE taking these medications    amLODipine 10 MG tablet Wait to take this until your doctor or other care provider tells you to start again. Commonly known as: NORVASC Take 10 mg by mouth daily.       STOP taking these medications    insulin  glargine 100 UNIT/ML injection Commonly known as: LANTUS        TAKE these medications    albuterol  108 (90 Base) MCG/ACT inhaler Commonly known as: VENTOLIN  HFA Inhale 1-2 puffs into the lungs every 6 (six) hours as needed for wheezing or shortness of breath.   ALPRAZolam 0.5 MG tablet Commonly known as: XANAX Take 0.5 mg by mouth 2 (two) times daily as needed. What changed: Another medication with the same name was removed. Continue taking this medication, and follow the directions you see here.   aspirin 325 MG tablet Take 325 mg by mouth daily.   benzonatate  100 MG capsule Commonly known as: TESSALON  Take 1 capsule (100 mg total) by mouth every 8 (eight) hours.   chlorpheniramine-HYDROcodone 10-8 MG/5ML Commonly known as: TUSSIONEX Take 5 mLs by mouth every 12 (twelve) hours as needed for cough.   Eylea 2 MG/0.05ML Soln Generic drug: Aflibercept 0.5 mLs by Intravitreal route as needed.   FLUoxetine  20 MG capsule Commonly known as: PROZAC  Take 20 mg by mouth daily.   insulin  lispro 100 UNIT/ML injection Commonly known as: HUMALOG Inject 6 Units into the skin 3 (three) times daily before meals. Per sliding scale   insulin  pump Soln Inject 1 each into the skin 3 times daily with meals, bedtime  and 2 AM.   levothyroxine  100 MCG tablet Commonly known as: SYNTHROID  Take 100 mcg by mouth daily before breakfast.   ondansetron  4 MG disintegrating tablet Commonly known as: ZOFRAN -ODT Take 4 mg by mouth every 8 (eight) hours as needed for nausea or vomiting.   pantoprazole  20 MG tablet Commonly known as: PROTONIX  Take 20 mg by mouth 2 (two) times daily.   Refresh Plus 0.5 % Soln Generic drug: Carboxymethylcellulose Sod PF Apply 2 drops to eye daily as needed.   rosuvastatin  20 MG tablet Commonly known as: CRESTOR  Take 20 mg by mouth daily.        Discharge Exam:  Constitutional: appears age-appropriate Neck: normal, supple, no masses, no thyromegaly Respiratory: clear to auscultation bilaterally, no wheezing, no crackles Cardiovascular: Regular rate and rhythm, no murmurs / rubs / gallops. No extremity edema Abdomen: soft, non tender, BS+, no CVA ttp b/l Skin: no rashes, lesions, ulcers. No induration Neurologic: AO X 3, no gross focal deficit  Condition at discharge: good  The results of significant diagnostics from this hospitalization (including imaging, microbiology, ancillary and  laboratory) are listed below for reference.   Imaging Studies: CT Renal Stone Study Result Date: 09/28/2023 CLINICAL DATA:  Flank pain.  Nausea and vomiting. EXAM: CT ABDOMEN AND PELVIS WITHOUT CONTRAST TECHNIQUE: Multidetector CT imaging of the abdomen and pelvis was performed following the standard protocol without IV contrast. RADIATION DOSE REDUCTION: This exam was performed according to the departmental dose-optimization program which includes automated exposure control, adjustment of the mA and/or kV according to patient size and/or use of iterative reconstruction technique. COMPARISON:  12/21/2012 FINDINGS: Lower chest: No acute findings. Hepatobiliary: No mass visualized on this unenhanced exam. Prior cholecystectomy. No evidence of biliary obstruction. Pancreas: No mass or  inflammatory process visualized on this unenhanced exam. Spleen:  Within normal limits in size. Adrenals/Urinary tract: No evidence of urolithiasis or hydronephrosis. Unremarkable unopacified urinary bladder. Stomach/Bowel: No evidence of obstruction, inflammatory process, or abnormal fluid collections. Mild diverticulosis is seen mainly involving the sigmoid colon, however there is no evidence of diverticulitis. Vascular/Lymphatic: No pathologically enlarged lymph nodes identified. No evidence of abdominal aortic aneurysm. Reproductive: Prior hysterectomy noted. Adnexal regions are unremarkable in appearance. Other:  None. Musculoskeletal:  No suspicious bone lesions identified. IMPRESSION: No evidence of urolithiasis, hydronephrosis, or other acute findings. Colonic diverticulosis, without radiographic evidence of diverticulitis. Electronically Signed   By: Norleen DELENA Kil M.D.   On: 09/28/2023 13:45   CT Head Wo Contrast Result Date: 09/28/2023 CLINICAL DATA:  Acute encephalopathy. EXAM: CT HEAD WITHOUT CONTRAST TECHNIQUE: Contiguous axial images were obtained from the base of the skull through the vertex without intravenous contrast. RADIATION DOSE REDUCTION: This exam was performed according to the departmental dose-optimization program which includes automated exposure control, adjustment of the mA and/or kV according to patient size and/or use of iterative reconstruction technique. COMPARISON:  06/19/2012 FINDINGS: Brain: No evidence of intracranial hemorrhage, acute infarction, hydrocephalus, extra-axial collection, or mass lesion/mass effect. Vascular:  No hyperdense vessel or other acute findings. Skull: No evidence of fracture or other significant bone abnormality. Sinuses/Orbits:  No acute findings. Other: None. IMPRESSION: Negative noncontrast head CT. Electronically Signed   By: Norleen DELENA Kil M.D.   On: 09/28/2023 13:39   DG Chest Portable 1 View Result Date: 09/28/2023 CLINICAL DATA:  Shortness of  breath. EXAM: PORTABLE CHEST 1 VIEW COMPARISON:  07/13/2023 FINDINGS: Median sternotomy wires and left-sided pacemaker unchanged. Lungs are adequately inflated without focal airspace consolidation or effusion. Cardiomediastinal silhouette and remainder of the exam is unchanged. IMPRESSION: No active disease. Electronically Signed   By: Toribio Agreste M.D.   On: 09/28/2023 13:22    Microbiology: Results for orders placed or performed during the hospital encounter of 09/28/23  Resp panel by RT-PCR (RSV, Flu A&B, Covid) Anterior Nasal Swab     Status: None   Collection Time: 09/28/23 11:42 AM   Specimen: Anterior Nasal Swab  Result Value Ref Range Status   SARS Coronavirus 2 by RT PCR NEGATIVE NEGATIVE Final    Comment: (NOTE) SARS-CoV-2 target nucleic acids are NOT DETECTED.  The SARS-CoV-2 RNA is generally detectable in upper respiratory specimens during the acute phase of infection. The lowest concentration of SARS-CoV-2 viral copies this assay can detect is 138 copies/mL. A negative result does not preclude SARS-Cov-2 infection and should not be used as the sole basis for treatment or other patient management decisions. A negative result may occur with  improper specimen collection/handling, submission of specimen other than nasopharyngeal swab, presence of viral mutation(s) within the areas targeted by this assay, and inadequate number of viral  copies(<138 copies/mL). A negative result must be combined with clinical observations, patient history, and epidemiological information. The expected result is Negative.  Fact Sheet for Patients:  BloggerCourse.com  Fact Sheet for Healthcare Providers:  SeriousBroker.it  This test is no t yet approved or cleared by the [place]  FDA and  has been authorized for detection and/or diagnosis of SARS-CoV-2 by FDA under an Emergency Use Authorization (EUA). This EUA will remain  in effect  (meaning this test can be used) for the duration of the COVID-19 declaration under Section 564(b)(1) of the Act, 21 U.S.C.section 360bbb-3(b)(1), unless the authorization is terminated  or revoked sooner.       Influenza A by PCR NEGATIVE NEGATIVE Final   Influenza B by PCR NEGATIVE NEGATIVE Final    Comment: (NOTE) The Xpert Xpress SARS-CoV-2/FLU/RSV plus assay is intended as an aid in the diagnosis of influenza from Nasopharyngeal swab specimens and should not be used as a sole basis for treatment. Nasal washings and aspirates are unacceptable for Xpert Xpress SARS-CoV-2/FLU/RSV testing.  Fact Sheet for Patients: BloggerCourse.com  Fact Sheet for Healthcare Providers: SeriousBroker.it  This test is not yet approved or cleared by the [place]  FDA and has been authorized for detection and/or diagnosis of SARS-CoV-2 by FDA under an Emergency Use Authorization (EUA). This EUA will remain in effect (meaning this test can be used) for the duration of the COVID-19 declaration under Section 564(b)(1) of the Act, 21 U.S.C. section 360bbb-3(b)(1), unless the authorization is terminated or revoked.     Resp Syncytial Virus by PCR NEGATIVE NEGATIVE Final    Comment: (NOTE) Fact Sheet for Patients: BloggerCourse.com  Fact Sheet for Healthcare Providers: SeriousBroker.it  This test is not yet approved or cleared by the [place]  FDA and has been authorized for detection and/or diagnosis of SARS-CoV-2 by FDA under an Emergency Use Authorization (EUA). This EUA will remain in effect (meaning this test can be used) for the duration of the COVID-19 declaration under Section 564(b)(1) of the Act, 21 U.S.C. section 360bbb-3(b)(1), unless the authorization is terminated or revoked.  Performed at [facility], [address] [place], [place] [address]      Labs: CBC: Recent Labs  Lab 09/28/23 1142 09/29/23 0629  WBC 11.8* 6.3  HGB 11.6* 10.5*  HCT 35.7* 32.6*  MCV 93.5 94.8  PLT 258 194   Basic Metabolic Panel: Recent Labs  Lab 09/28/23 1142 09/28/23 1434 09/29/23 0629 09/29/23 1823 09/30/23 0404  NA 141  --  131* 136 135  K 3.7  --  5.7* 4.6 4.1  CL 103  --  99 99 101  CO2 21*  --  23 26 27   GLUCOSE 75  --  470* 197* 125*  BUN 34*  --  29* 29* 26*  CREATININE 2.54*  --  1.88* 1.96* 1.65*  CALCIUM  10.1  --  8.6* 9.1 8.8*  MG 2.0  --   --   --  1.9  PHOS  --  2.6  --   --  3.9   Liver Function Tests: Recent Labs  Lab 09/28/23 1142  AST 27  ALT 17  ALKPHOS 49  BILITOT 0.8  PROT 7.9  ALBUMIN 4.4   CBG: Recent Labs  Lab 09/29/23 1741 09/29/23 2055 09/30/23 0223 09/30/23 0753 09/30/23 1210  GLUCAP 173* 96 124* 98 101*    Discharge time spent: greater than 30 minutes.  Signed: Laree Lock, MD [facility] 09/30/2023

## 2023-09-30 NOTE — Plan of Care (Signed)

## 2023-09-30 NOTE — Inpatient Diabetes Management (Signed)
 Inpatient Diabetes Program Recommendations  AACE/ADA: New Consensus Statement on Inpatient Glycemic Control  Target Ranges:  Prepandial:   less than 140 mg/dL      Peak postprandial:   less than 180 mg/dL (1-2 hours)      Critically ill patients:  140 - 180 mg/dL    Latest Reference Range & Units 09/29/23 06:32 09/29/23 08:52 09/29/23 11:33 09/29/23 14:39 09/29/23 17:41 09/29/23 20:55 09/30/23 02:23 09/30/23 07:53 09/30/23 12:10  Glucose-Capillary 70 - 99 mg/dL 580 (H) 611 (H) 553 (H) 363 (H) 173 (H) 96 124 (H) 98 101 (H)    Latest Reference Range & Units 09/28/23 12:13 09/28/23 12:33 09/28/23 13:55 09/28/23 15:31 09/28/23 17:59 09/28/23 19:46 09/28/23 21:48 09/29/23 00:12 09/29/23 03:25  Glucose-Capillary 70 - 99 mg/dL 41 (LL) 96 31 (LL) 876 (H) 251 (H) 332 (H) 372 (H) 242 (H) 298 (H)   Review of Glycemic Control  Diabetes history: DM1 Outpatient Diabetes medications: T-Slim insulin  pump with Humalog and Dexcom G7 CGM Current orders for Inpatient glycemic control: Insulin  Pump AC&HS and 2am  Spoke with patient at bedside regarding diabetes and home regimen for diabetes management.  Patient states that she has DM1, uses T-Slim insulin  pump, and was followed by Endocrinologist in [place] area. Patient states she just moved from [place] area 2 months ago so she does not have an Endocrinologist yet. Patient reports that prior to coming to the hospital, her glucose was over 500 so she took SQ insulin  injection so she would not go into DKA. Then her glucose went low at the hospital and her insulin  pump was stopped.  Patient reports her glucose got high before she was allowed to resume her insulin  pump.   Patient states that she resumed her insulin  pump yesterday morning and glucose has significantly improved since then. Patient reports that uses Control IQ mode on her pump and A1C is usually in 6% range. Reviewed insulin  pump settings which are as follows:   Basal insulin   12A 0.8  units/hour 3A 0.9 units/hour 8A 0.9 units/hour 5P 0.725 units/hour 8:30P 0.725 units/hour Total daily basal insulin :  19.175 units/24 hours  Carb Coverage 12A    1:10 1 unit for every 10 grams of carbohydrates 3A      1:10 1 unit for every 10 grams of carbohydrates 8A      1:7.8 1 unit for every 7.80 grams of carbohydrates 5P      1:7 1 unit for every 7 grams of carbohydrates 8:30P 1:8 1 unit for every 18 grams of carbohydrates  Insulin  Sensitivity 12A  1:45 1 unit drops blood glucose 45 mg/dl 3A     8:54 1 unit drops blood glucose 45 mg/dl 8A     8:54 1 unit drops blood glucose 45 mg/dl 5P     8:54 1 unit drops blood glucose 45 mg/dl 1:69E  8:51 1 unit drops blood glucose 48 mg/dl  Target Glucose Goals 87J 110 mg/dl  Patient states that she is discharging home today.  She has everything she needs at home for DM control. Patient states she recently told she had CKD and would like to learn more about renal diet. Placed order for inpatient RD consult and also for outpatient DM referral for further diet education. Patient asked about Endocrinologist office in [place]. Informed of [facility] and added contact information to discharge instructions. Asked that patient talk with PCP and get referral to Endocrinology.  Patient verbalized understanding of information discussed and states that she does not have  any further questions related to diabetes at this time.  Thanks, Earnie Gainer, RN, MSN, CDCES Diabetes Coordinator Inpatient Diabetes Program [phone] (Team Pager from 8am to 5pm)

## 2023-09-30 NOTE — Discharge Instructions (Signed)
 Please ask your primary care to make referral to [facility] so you can get established with local Endocrinology.  [facility] [address] [place], [place] [address] Get Directions Appointments [phone] Office [phone]

## 2023-10-01 NOTE — ED Triage Notes (Signed)
 Patient ambulatory to triage. Reports chest pain, nausea, dizziness. Was discharged from [facility] yesterday and was at PCP today and told to go into ED.  Hx diabetes, reports BG has been within normal range today.

## 2023-10-03 ENCOUNTER — Other Ambulatory Visit: Payer: Self-pay

## 2023-10-03 DIAGNOSIS — E118 Type 2 diabetes mellitus with unspecified complications: Secondary | ICD-10-CM

## 2023-10-03 LAB — BLOOD GAS, VENOUS
Bicarbonate: 27.9 mmol/L — AB (ref 20.0–28.0)
Patient temperature: 37 mmol/L — AB (ref 0.0–2.0)
pCO2, Ven: 45 mmHg (ref 44–60)
pH, Ven: 7.4 (ref 7.25–7.43)
pO2, Ven: 27.9 mmHg (ref 32–45)

## 2023-10-04 NOTE — CV Procedure (Signed)
  Device system confirmed to be MRI conditional, with implant date > 6 weeks ago, and no evidence of abandoned or epicardial leads in review of most recent CXR  Device last cleared by EP Provider: Charlies Arthur 10/03/23  Clearance is good through for 1 year as long as parameters remain stable at time of check. If pt undergoes a cardiac device procedure during that time, they should be re-cleared.   Tachy-therapies to be programmed off if applicable with device back to pre-MRI settings after completion of exam.  [employer] - Programming recommendation received through [employer] App/Tablet  Rocky Catalan, RT  10/04/2023 7:58 AM

## 2023-10-07 ENCOUNTER — Telehealth: Payer: Self-pay | Admitting: Dietician

## 2023-10-07 NOTE — Telephone Encounter (Signed)
 Returned patient call. She is having issues with her Dexcom staying connected to her phone but it is connected and working with her pump.  Gave her some tips but discussed calleing Dexcom for furhter sensor issues. She states that she was hospitalized recently due to high blood glucose.  Discussed issues with infusion set/canula bending or coming out. 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 She moved this summer from [place] and is waiting to get a local endocrinologist and would like to see someone at the [facility] as they are close. Discussed her seeing me for diabetes education and patient would like to wait until she is seen at the [facility] to see if they have a diabetes educator there which is closer to her home. She discussed her AKI from the high blood glucose and gfr which has returned to her normal range. Discussed diet basics such as low sodium, more plant and less animal protein.  She has googled and gotten inacurate advice from the internet on the kidney diet. Patient has my name and number in the event that she has questions.  Leita Constable, RD, LDN, CDCES, DipACLM

## 2023-10-08 ENCOUNTER — Ambulatory Visit (HOSPITAL_COMMUNITY)
Admission: RE | Admit: 2023-10-08 | Discharge: 2023-10-08 | Disposition: A | Discharge status: Home / Self Care | Source: Ambulatory Visit | Attending: Ophthalmology | Admitting: Ophthalmology

## 2023-10-08 DIAGNOSIS — H0520 Unspecified exophthalmos: Secondary | ICD-10-CM | POA: Diagnosis not present

## 2023-10-08 DIAGNOSIS — E079 Disorder of thyroid, unspecified: Secondary | ICD-10-CM | POA: Diagnosis not present

## 2023-10-08 DIAGNOSIS — H5789 Other specified disorders of eye and adnexa: Secondary | ICD-10-CM | POA: Diagnosis present

## 2023-10-08 NOTE — Progress Notes (Signed)
 Patient was monitored by this RN during MRI scan due to presence of a pacemaker. Cardiac rhythm was continuously monitored throughout the procedure. Prior to the start of the scan, the pacemaker was placed in MRI-safe mode by the MRI technician and/or pacemaker representative. Following the completion of the scan, the device was returned to its pre-MRI settings. Neurological status and orientation post-procedure were unchanged from baseline.   Pre-procedure Heart Rate (Prior to being placed in MRI safe mode): 64 Post-procedure Heart Rate (Once pacemaker is returned to baseline mode): 69

## 2023-12-10 ENCOUNTER — Ambulatory Visit: Payer: Self-pay | Admitting: Internal Medicine

## 2024-01-13 ENCOUNTER — Other Ambulatory Visit (HOSPITAL_COMMUNITY): Payer: Self-pay | Admitting: Vascular Surgery

## 2024-01-13 ENCOUNTER — Inpatient Hospital Stay
Admission: RE | Admit: 2024-01-13 | Discharge: 2024-01-13 | Disposition: A | Discharge status: Home / Self Care | Payer: Self-pay | Source: Ambulatory Visit | Attending: Vascular Surgery | Admitting: Vascular Surgery

## 2024-01-13 DIAGNOSIS — I6529 Occlusion and stenosis of unspecified carotid artery: Secondary | ICD-10-CM

## 2024-02-11 ENCOUNTER — Encounter: Payer: Self-pay | Admitting: Vascular Surgery

## 2024-02-11 ENCOUNTER — Ambulatory Visit: Admitting: Vascular Surgery

## 2024-02-11 VITALS — BP 110/66 | HR 70 | Temp 98.1°F | Resp 18 | Ht 62.0 in | Wt 158.7 lb

## 2024-02-11 DIAGNOSIS — I6523 Occlusion and stenosis of bilateral carotid arteries: Secondary | ICD-10-CM | POA: Diagnosis not present

## 2024-02-11 DIAGNOSIS — I779 Disorder of arteries and arterioles, unspecified: Secondary | ICD-10-CM | POA: Insufficient documentation

## 2024-02-12 ENCOUNTER — Other Ambulatory Visit: Payer: Self-pay

## 2024-02-12 DIAGNOSIS — I6523 Occlusion and stenosis of bilateral carotid arteries: Secondary | ICD-10-CM

## 2024-09-01 ENCOUNTER — Ambulatory Visit (HOSPITAL_COMMUNITY)

## 2024-09-01 ENCOUNTER — Ambulatory Visit: Admitting: Vascular Surgery
